# Patient Record
Sex: Male | Born: 1955 | Race: White | Hispanic: No | Marital: Married | Smoking: Never smoker
Health system: Southern US, Community
[De-identification: ages and names within clinical notes are randomized; demographics above are authoritative.]

## PROBLEM LIST (undated history)

## (undated) DIAGNOSIS — E7800 Pure hypercholesterolemia, unspecified: Secondary | ICD-10-CM

## (undated) DIAGNOSIS — IMO0002 Reserved for concepts with insufficient information to code with codable children: Secondary | ICD-10-CM

## (undated) DIAGNOSIS — K469 Unspecified abdominal hernia without obstruction or gangrene: Secondary | ICD-10-CM

## (undated) DIAGNOSIS — I10 Essential (primary) hypertension: Secondary | ICD-10-CM

## (undated) HISTORY — DX: Reserved for concepts with insufficient information to code with codable children: IMO0002

---
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
## 1976-09-05 HISTORY — PX: WISDOM TOOTH EXTRACTION: SHX21

## 2013-07-16 ENCOUNTER — Emergency Department (HOSPITAL_BASED_OUTPATIENT_CLINIC_OR_DEPARTMENT_OTHER): Payer: 59

## 2013-07-16 ENCOUNTER — Emergency Department (HOSPITAL_BASED_OUTPATIENT_CLINIC_OR_DEPARTMENT_OTHER)
Admission: EM | Admit: 2013-07-16 | Discharge: 2013-07-16 | Disposition: A | Discharge status: Home / Self Care | Payer: 59 | Attending: Emergency Medicine | Admitting: Emergency Medicine

## 2013-07-16 ENCOUNTER — Encounter (HOSPITAL_BASED_OUTPATIENT_CLINIC_OR_DEPARTMENT_OTHER): Payer: Self-pay | Admitting: Emergency Medicine

## 2013-07-16 DIAGNOSIS — IMO0002 Reserved for concepts with insufficient information to code with codable children: Secondary | ICD-10-CM

## 2013-07-16 DIAGNOSIS — K2970 Gastritis, unspecified, without bleeding: Secondary | ICD-10-CM

## 2013-07-16 DIAGNOSIS — R1012 Left upper quadrant pain: Secondary | ICD-10-CM | POA: Insufficient documentation

## 2013-07-16 DIAGNOSIS — Z862 Personal history of diseases of the blood and blood-forming organs and certain disorders involving the immune mechanism: Secondary | ICD-10-CM | POA: Insufficient documentation

## 2013-07-16 DIAGNOSIS — Z79899 Other long term (current) drug therapy: Secondary | ICD-10-CM | POA: Insufficient documentation

## 2013-07-16 DIAGNOSIS — K4090 Unilateral inguinal hernia, without obstruction or gangrene, not specified as recurrent: Secondary | ICD-10-CM

## 2013-07-16 DIAGNOSIS — R109 Unspecified abdominal pain: Secondary | ICD-10-CM

## 2013-07-16 DIAGNOSIS — K279 Peptic ulcer, site unspecified, unspecified as acute or chronic, without hemorrhage or perforation: Secondary | ICD-10-CM

## 2013-07-16 DIAGNOSIS — R1013 Epigastric pain: Secondary | ICD-10-CM | POA: Insufficient documentation

## 2013-07-16 DIAGNOSIS — I10 Essential (primary) hypertension: Secondary | ICD-10-CM | POA: Insufficient documentation

## 2013-07-16 DIAGNOSIS — R1011 Right upper quadrant pain: Secondary | ICD-10-CM | POA: Insufficient documentation

## 2013-07-16 DIAGNOSIS — Z8639 Personal history of other endocrine, nutritional and metabolic disease: Secondary | ICD-10-CM | POA: Insufficient documentation

## 2013-07-16 HISTORY — DX: Reserved for concepts with insufficient information to code with codable children: IMO0002

## 2013-07-16 HISTORY — DX: Unspecified abdominal hernia without obstruction or gangrene: K46.9

## 2013-07-16 HISTORY — DX: Essential (primary) hypertension: I10

## 2013-07-16 HISTORY — DX: Pure hypercholesterolemia, unspecified: E78.00

## 2013-07-16 LAB — CBC WITH DIFFERENTIAL/PLATELET
Basophils Relative: 0 % (ref 0–1)
Hemoglobin: 16.5 g/dL (ref 13.0–17.0)
Lymphs Abs: 2.5 10*3/uL (ref 0.7–4.0)
MCH: 31.5 pg (ref 26.0–34.0)
MCHC: 34.2 g/dL (ref 30.0–36.0)
Monocytes Relative: 6 % (ref 3–12)
Neutro Abs: 9.8 10*3/uL — ABNORMAL HIGH (ref 1.7–7.7)
Neutrophils Relative %: 74 % (ref 43–77)
Platelets: 245 10*3/uL (ref 150–400)
RBC: 5.23 MIL/uL (ref 4.22–5.81)
RDW: 13.1 % (ref 11.5–15.5)

## 2013-07-16 LAB — URINALYSIS, ROUTINE W REFLEX MICROSCOPIC
Bilirubin Urine: NEGATIVE
Glucose, UA: NEGATIVE mg/dL
Hgb urine dipstick: NEGATIVE
Ketones, ur: NEGATIVE mg/dL
Specific Gravity, Urine: 1.021 (ref 1.005–1.030)
Urobilinogen, UA: 0.2 mg/dL (ref 0.0–1.0)
pH: 6.5 (ref 5.0–8.0)

## 2013-07-16 LAB — URINE MICROSCOPIC-ADD ON

## 2013-07-16 LAB — COMPREHENSIVE METABOLIC PANEL
ALT: 64 U/L — ABNORMAL HIGH (ref 0–53)
Albumin: 4.3 g/dL (ref 3.5–5.2)
Alkaline Phosphatase: 88 U/L (ref 39–117)
BUN: 16 mg/dL (ref 6–23)
Chloride: 99 mEq/L (ref 96–112)
GFR calc non Af Amer: 74 mL/min — ABNORMAL LOW (ref >90)
Glucose, Bld: 166 mg/dL — ABNORMAL HIGH (ref 70–99)
Potassium: 3.8 mEq/L (ref 3.5–5.1)
Sodium: 138 mEq/L (ref 135–145)
Total Bilirubin: 0.3 mg/dL (ref 0.3–1.2)

## 2013-07-16 LAB — LIPASE, BLOOD: Lipase: 27 U/L (ref 11–59)

## 2013-07-16 MED ORDER — OMEPRAZOLE 20 MG PO CPDR: 20.0000 mg | DELAYED_RELEASE_CAPSULE | Freq: Two times a day (BID) | ORAL | Status: DC

## 2013-07-16 MED ORDER — OXYCODONE-ACETAMINOPHEN 5-325 MG PO TABS: 2.0000 | ORAL_TABLET | ORAL | Status: DC | PRN

## 2013-07-16 MED ORDER — SUCRALFATE 1 G PO TABS: 1.0000 g | ORAL_TABLET | Freq: Four times a day (QID) | ORAL | Status: DC

## 2013-07-16 MED ORDER — PANTOPRAZOLE SODIUM 40 MG IV SOLR
40.0000 mg | Freq: Once | INTRAVENOUS | Status: AC
  Administered 2013-07-16: 40 mg via INTRAVENOUS

## 2013-07-16 MED ORDER — HYDROMORPHONE HCL PF 1 MG/ML IJ SOLN
INTRAMUSCULAR | Status: AC
  Administered 2013-07-16: 1 mg via INTRAVENOUS
  Filled 2013-07-16: qty 1

## 2013-07-16 MED ORDER — ONDANSETRON 4 MG PO TBDP: 4.0000 mg | ORAL_TABLET | Freq: Three times a day (TID) | ORAL | Status: DC | PRN

## 2013-07-16 MED ORDER — HYDROMORPHONE HCL PF 1 MG/ML IJ SOLN
1.0000 mg | INTRAMUSCULAR | Status: AC | PRN
  Administered 2013-07-16 (×2): 1 mg via INTRAVENOUS
  Filled 2013-07-16: qty 1

## 2013-07-16 MED ORDER — ONDANSETRON HCL 4 MG/2ML IJ SOLN
4.0000 mg | Freq: Once | INTRAMUSCULAR | Status: AC
  Administered 2013-07-16: 4 mg via INTRAVENOUS

## 2013-07-16 MED ORDER — HYDROMORPHONE HCL PF 1 MG/ML IJ SOLN
1.0000 mg | INTRAMUSCULAR | Status: DC | PRN
  Administered 2013-07-16: 1 mg via INTRAVENOUS
  Filled 2013-07-16: qty 1

## 2013-07-16 MED ORDER — ONDANSETRON HCL 4 MG/2ML IJ SOLN
INTRAMUSCULAR | Status: AC
  Administered 2013-07-16: 4 mg via INTRAVENOUS
  Filled 2013-07-16: qty 2

## 2013-07-16 MED ORDER — LORAZEPAM 1 MG PO TABS: 1.0000 mg | ORAL_TABLET | ORAL | Status: DC | PRN

## 2013-07-16 MED ORDER — PANTOPRAZOLE SODIUM 40 MG IV SOLR
INTRAVENOUS | Status: AC
  Administered 2013-07-16: 40 mg via INTRAVENOUS
  Filled 2013-07-16: qty 40

## 2013-07-16 NOTE — ED Notes (Signed)
Patient transported to Ultrasound 

## 2013-07-16 NOTE — ED Notes (Signed)
RN bedside.

## 2013-07-16 NOTE — ED Provider Notes (Signed)
CSN: [id number]     Arrival date & time 07/16/13  4098 History   First MD Initiated Contact with Patient 07/16/13 [phone]     Chief Complaint  Patient presents with  . Abdominal Pain  . Emesis    HPI  Patient presents with abdominal pain since early this morning. Primarily epigastric but is somewhat of the bilateral upper quadrants. It is not extending to his chest, neck, back, or shoulders. No lower abdominal pain. He has had several episodes of emesis. It is not relieved his pain. Emesis is nonbloody, nonbilious. Normal bowel movement yesterday. He has not been constipated or having diarrhea. No fevers. He does not smoke. He drinks 5 or 6 alcoholic beverages a day. He has not had a past similar episodes. He has no history of alcohol related liver or pancreas abnormalities.  Past Medical History  Diagnosis Date  . Hypertension   . High cholesterol   . Hernia    History reviewed. No pertinent past surgical history. History reviewed. No pertinent family history. History  Substance Use Topics  . Smoking status: Never Smoker   . Smokeless tobacco: Not on file  . Alcohol Use: 3.0 oz/week    5 Cans of beer per week    Review of Systems  Constitutional: Negative for fever, chills, diaphoresis, appetite change and fatigue.  HENT: Negative for mouth sores, sore throat and trouble swallowing.   Eyes: Negative for visual disturbance.  Respiratory: Negative for cough, chest tightness, shortness of breath and wheezing.   Cardiovascular: Negative for chest pain.  Gastrointestinal: Positive for nausea, vomiting and abdominal pain. Negative for diarrhea and abdominal distention.  Endocrine: Negative for polydipsia, polyphagia and polyuria.  Genitourinary: Negative for dysuria, frequency and hematuria.  Musculoskeletal: Negative for gait problem.  Skin: Negative for color change, pallor and rash.  Neurological: Negative for dizziness, syncope, light-headedness and headaches.  Hematological:  Does not bruise/bleed easily.  Psychiatric/Behavioral: Negative for behavioral problems and confusion.    Allergies  Review of patient's allergies indicates no known allergies.  Home Medications   Current Outpatient Rx  Name  Route  Sig  Dispense  Refill  . omeprazole (PRILOSEC) 20 MG capsule   Oral   Take 1 capsule (20 mg total) by mouth 2 (two) times daily.   60 capsule   0   . ondansetron (ZOFRAN ODT) 4 MG disintegrating tablet   Oral   Take 1 tablet (4 mg total) by mouth every 8 (eight) hours as needed for nausea.   20 tablet   0   . oxyCODONE-acetaminophen (PERCOCET/ROXICET) 5-325 MG per tablet   Oral   Take 2 tablets by mouth every 4 (four) hours as needed.   6 tablet   0   . oxyCODONE-acetaminophen (PERCOCET/ROXICET) 5-325 MG per tablet   Oral   Take 2 tablets by mouth every 4 (four) hours as needed.   30 tablet   0   . sucralfate (CARAFATE) 1 G tablet   Oral   Take 1 tablet (1 g total) by mouth 4 (four) times daily.   60 tablet   0    BP 155/90  Pulse 92  Temp(Src) 97.4 F (36.3 C) (Oral)  Resp 16  Ht 6\' 2"  (1.88 m)  Wt 245 lb (111.131 kg)  BMI 31.44 kg/m2  SpO2 96% Physical Exam  Constitutional: He is oriented to person, place, and time. He appears well-developed and well-nourished. He appears distressed.  Appears painful.    HENT:  Head: Normocephalic.  Eyes: Conjunctivae are normal. Pupils are equal, round, and reactive to light. No scleral icterus.  Conjunctiva are not pale. No scleral icterus.  Neck: Normal range of motion. Neck supple. No thyromegaly present.  Cardiovascular: Normal rate and regular rhythm.  Exam reveals no gallop and no friction rub.   No murmur heard. Pulmonary/Chest: Effort normal and breath sounds normal. No respiratory distress. He has no wheezes. He has no rales.  Abdominal: Soft. Bowel sounds are normal. He exhibits no distension. There is tenderness in the right upper quadrant, epigastric area and left upper  quadrant. There is no rigidity, no rebound and no guarding.    Nontender in the lower abdomen. Primary area of tenderness is epigastric. Has a rapid quadrant tenderness. Does not have an abnormal Murphy's sign.  Musculoskeletal: Normal range of motion.  Neurological: He is alert and oriented to person, place, and time.  Skin: Skin is warm and dry. No rash noted.  Psychiatric: He has a normal mood and affect. His behavior is normal.    ED Course  Procedures (including critical care time) Labs Review Labs Reviewed  CBC WITH DIFFERENTIAL - Abnormal; Notable for the following:    WBC 13.2 (*)    Neutro Abs 9.8 (*)    All other components within normal limits  COMPREHENSIVE METABOLIC PANEL - Abnormal; Notable for the following:    Glucose, Bld 166 (*)    ALT 64 (*)    GFR calc non Af Amer 74 (*)    GFR calc Af Amer 86 (*)    All other components within normal limits  URINALYSIS, ROUTINE W REFLEX MICROSCOPIC - Abnormal; Notable for the following:    APPearance CLOUDY (*)    Leukocytes, UA SMALL (*)    All other components within normal limits  URINE MICROSCOPIC-ADD ON - Abnormal; Notable for the following:    Bacteria, UA MANY (*)    All other components within normal limits  URINE CULTURE  LIPASE, BLOOD  TROPONIN I   Imaging Review US Abdomen Complete  07/16/2013   CLINICAL DATA:  Epigastric pain. Nausea and vomiting.  EXAM: ULTRASOUND ABDOMEN COMPLETE  COMPARISON:  None.  FINDINGS: Gallbladder  No stones, wall thickening or pericholecystic fluid. Possible 0.2 cm polyp is incidentally noted. Sonographer reports negative Murphy's sign.  Common bile duct  Diameter: 0.3 cm.  Liver  The liver demonstrates diffusely increased echogenicity without focal lesion or intrahepatic biliary ductal dilatation.  IVC  No abnormality visualized.  Pancreas  Visualized portion unremarkable.  Spleen  Size and appearance within normal limits.  Right Kidney  Length: 11.9 cm. Echogenicity within normal  limits. No mass or hydronephrosis visualized.  Left Kidney  Length: 12.1 cm. Echogenicity within normal limits. No mass or hydronephrosis visualized.  Abdominal aorta  No aneurysm visualized.  IMPRESSION: Negative for gallstones. No acute finding. Possible 0.2 cm gallbladder polyp noted.  Fatty infiltration of the liver.   Electronically Signed   By: Drusilla Kanner M.D.   On: 07/16/2013 09:03   Dg Abd Acute W/chest  07/16/2013   CLINICAL DATA:  Epigastric pain, nausea and vomiting.  EXAM: ACUTE ABDOMEN SERIES (ABDOMEN 2 VIEW & CHEST 1 VIEW)  COMPARISON:  Single view of the chest 06/30/2010.  FINDINGS: Single view of the chest demonstrates clear lungs and normal heart size. No pneumothorax or pleural fluid.  Two views of the abdomen show no free intraperitoneal air or evidence of bowel obstruction. Multiple gas-filled but nondilated loops of small bowel are identified. No  abnormal abdominal calcification is seen.  IMPRESSION: Gas-filled but nondilated loops of small bowel most suggestive gastroenteritis. Negative for free air or bowel obstruction.  No acute cardiopulmonary disease.   Electronically Signed   By: Drusilla Kanner M.D.   On: 07/16/2013 09:00    EKG Interpretation     Ventricular Rate:  71 PR Interval:  220 QRS Duration: 92 QT Interval:  398 QTC Calculation: 432 R Axis:   7 Text Interpretation:  Sinus rhythm with 1st degree A-V block Otherwise normal ECG            MDM   1. Abdominal pain   2. Peptic ulcer disease   3. Gastritis   4. Inguinal hernia     Epigastric abdominal pain and 57 year old male started spontaneously at night with a history of alcohol abuse. Different diagnosis would include alcohol related gastritis ulcer hepatitis pancreatitis. Biliary colic or cholecystitis. His laboratory evaluation IV pain medication and antiemetics, plus Protonix.  08:00:  Re-evaluation.  Patient's pain went from an 8 to a 7 to a 4 with a first, and second dose of 1 mg IV  Dilaudid. His hepatobiliary or pancreatic enzymes are normal. White blood cell count is slightly elevated. Pain remains primarily epigastric and medial bilateral upper quadrants. No chest pain. I requested a troponin and a urine, although his pain does not seem cardiac in his not in his chest now nor has it been. His pain is not in the flank. I requested an ultrasound to evaluate for biliary dilatation or obvious stones. Requested abdominal series x-rays return if there is no free air or obstruction. His exam does not suggest obstruction. He does not have high-pitched rushes. CT [facility] because of mechanical issue as the CT scanner. The plan at this point is to continue to control his pain evaluated him for the above abnormalities with ultrasound, additional labs, and plain film x-ray series. I will further evaluate his symptoms and need for additional studies including possibility of CT after the above.  10:50:  Reevaluation. Patient has great pain relief. He is actually intermittently sleeping. He awakens easily. He is not sedate. He rates his pain is 1/10. He is to localize to his epigastrium. Troponin is normal. Ultrasound shows no biliary abnormalities or stones. Has had a gallbladder polyp. No sign of perforation or obstruction on abdominal series. I think is appropriate for outpatient treatment. I do not find concerns for peritonitis or surgical condition. I do not fill his CT scanning at this point is indicated or would be helpful. My plan is Carafate, proton pump inhibitor, Zofran and Vicodin. Return for fevers bloody diarrhea worsening pain other symptoms. Surgical referral regarding his inguinal hernia.   Roney Marion, MD 07/17/13 [phone]

## 2013-07-16 NOTE — ED Notes (Signed)
MD at bedside. 

## 2013-07-16 NOTE — ED Notes (Signed)
MD at bedside discussing test results and plan of care. 

## 2013-07-16 NOTE — ED Notes (Signed)
Epigastric pain since 3am with vomiting x2.

## 2013-07-16 NOTE — ED Notes (Signed)
Pt wife called and sts pt does not have a PCP and needs more than 6 pain meds that were prescribed today. Dr. Inda Merlin he will prescribe more pain meds to [facility] outpt pharm. Wife will pick up med for pt.

## 2013-07-17 LAB — URINE CULTURE: Culture: NO GROWTH

## 2013-09-05 HISTORY — PX: POLYPECTOMY: SHX149

## 2013-09-05 HISTORY — PX: COLONOSCOPY: SHX174

## 2013-11-26 ENCOUNTER — Ambulatory Visit (INDEPENDENT_AMBULATORY_CARE_PROVIDER_SITE_OTHER): Payer: 59 | Admitting: General Surgery

## 2013-11-26 ENCOUNTER — Encounter (INDEPENDENT_AMBULATORY_CARE_PROVIDER_SITE_OTHER): Payer: Self-pay | Admitting: General Surgery

## 2013-11-26 VITALS — BP 142/100 | HR 80 | Temp 97.6°F | Resp 14 | Ht 73.0 in | Wt 230.4 lb

## 2013-11-26 DIAGNOSIS — K4090 Unilateral inguinal hernia, without obstruction or gangrene, not specified as recurrent: Secondary | ICD-10-CM

## 2013-11-26 DIAGNOSIS — I10 Essential (primary) hypertension: Secondary | ICD-10-CM

## 2013-11-26 NOTE — Addendum Note (Signed)
Addended by: Ivor Costa on: 11/26/2013 03:10 PM   Modules accepted: Orders

## 2013-11-26 NOTE — Progress Notes (Signed)
Patient ID: Anthony Moody, male   DOB: 11-23-1955, 58 y.o.   MRN: [id number]  Chief Complaint  Patient presents with  . New Evaluation    eval [facility]    HPI Anthony Moody is a 58 y.o. male.  The patient is a 58 year old male who has a chief complaint of a left inguinal hernia. The patient was seen in the ER in November of last year. He states that the hernia he noticed was a few months before that time. He's had some discomfort in the area. He notices no pain in the area and  Has had no signs or symptoms of incarceration.   Of note the patient currently has no primary care physician and has hypertension with blood pressure 142/100 today. HPI  Past Medical History  Diagnosis Date  . Hypertension   . High cholesterol   . Hernia     Past Surgical History  Procedure Laterality Date  . Tonsilectomy/adenoidectomy with myringotomy  09/05/1966    History reviewed. No pertinent family history.  Social History History  Substance Use Topics  . Smoking status: Never Smoker   . Smokeless tobacco: Never Used  . Alcohol Use: 3.0 oz/week    5 Cans of beer per week     Comment: occ    No Known Allergies  No current outpatient prescriptions on file.   No current facility-administered medications for this visit.    Review of Systems Review of Systems  Constitutional: Negative.   HENT: Negative.   Eyes: Negative.   Respiratory: Negative.   Cardiovascular: Negative.   Gastrointestinal: Negative.   Endocrine: Negative.   Neurological: Negative.     Blood pressure 142/100, pulse 80, temperature 97.6 F (36.4 C), temperature source Temporal, resp. rate 14, height 6\' 1"  (1.854 m), weight 230 lb 6.4 oz (104.509 kg).  Physical Exam Physical Exam  Constitutional: He is oriented to person, place, and time. He appears well-developed and well-nourished.  HENT:  Head: Normocephalic and atraumatic.  Eyes: Conjunctivae and EOM are normal. Pupils are equal, round, and reactive to light.   Neck: Normal range of motion. Neck supple.  Cardiovascular: Normal rate, regular rhythm and normal heart sounds.   Pulmonary/Chest: Effort normal and breath sounds normal.  Abdominal: Soft. Bowel sounds are normal. He exhibits no distension and no mass. There is no tenderness. There is no rebound and no guarding. A hernia is present. Hernia confirmed positive in the left inguinal area. Hernia confirmed negative in the right inguinal area.  Musculoskeletal: Normal range of motion.  Neurological: He is alert and oriented to person, place, and time.  Skin: Skin is warm and dry.    Data Reviewed As above  Assessment    58 year old male with a large left inguinal hernia.     Plan    1. We'll have the patient evaluated by medicine physicians weight for his high blood pressure. 2. At that point once he's been evaluated we can discuss scheduling surgery for a open left inguinal hernia repair with mesh. 3.All risks and benefits were discussed with the patient, to generally include infection, bleeding, damage to surrounding structures, acute and chronic nerve pain, and recurrence. Alternatives were offered and described.  All questions were answered and the patient voiced understanding of the procedure and wishes to proceed at this point.        Rosario Jacks., Sheray Grist 11/26/2013, 10:55 AM

## 2013-11-28 ENCOUNTER — Telehealth (INDEPENDENT_AMBULATORY_CARE_PROVIDER_SITE_OTHER): Payer: Self-pay

## 2013-11-28 NOTE — Telephone Encounter (Signed)
Message copied by Ivor Costa on Thu Nov 28, 2013  4:40 PM ------      Message from: [place], [place]: Thu Nov 28, 2013  2:57 PM      Contact: [phone]       PLEASE CALL HIM HE HAS A QUESTION ABOUT SOME DOCTOR DR.RAMIREZ WAS TALKING TO HIM ABOUT I LOOKED IN THE NOTE I DON'T SEE A DOCTOR IN THE NOTE ------

## 2013-11-28 NOTE — Telephone Encounter (Signed)
Called and spoke to patient to make aware that our office will schedule the Referral to Dr. Ardeth Perfect.  Request has been sent to his office and we will call patient once we have appointment date & time.  Patient verbalized understanding.

## 2014-03-31 ENCOUNTER — Encounter: Payer: Self-pay | Admitting: Gastroenterology

## 2014-05-05 ENCOUNTER — Ambulatory Visit (AMBULATORY_SURGERY_CENTER): Payer: Self-pay | Admitting: *Deleted

## 2014-05-05 VITALS — Ht 74.0 in | Wt 214.8 lb

## 2014-05-05 DIAGNOSIS — Z1211 Encounter for screening for malignant neoplasm of colon: Secondary | ICD-10-CM

## 2014-05-05 MED ORDER — MOVIPREP 100 G PO SOLR: ORAL | Status: DC

## 2014-05-05 NOTE — Progress Notes (Signed)
No allergies to eggs or soy. No problems with anesthesia.  Pt given Emmi instructions for colonoscopy  No oxygen use  No diet drug use  

## 2014-05-19 ENCOUNTER — Encounter: Payer: Self-pay | Admitting: Gastroenterology

## 2014-05-19 ENCOUNTER — Ambulatory Visit (AMBULATORY_SURGERY_CENTER): Payer: 59 | Admitting: Gastroenterology

## 2014-05-19 VITALS — BP 131/82 | HR 65 | Temp 97.9°F | Resp 17 | Ht 74.0 in | Wt 214.0 lb

## 2014-05-19 DIAGNOSIS — Z1211 Encounter for screening for malignant neoplasm of colon: Secondary | ICD-10-CM

## 2014-05-19 DIAGNOSIS — D122 Benign neoplasm of ascending colon: Secondary | ICD-10-CM

## 2014-05-19 DIAGNOSIS — D126 Benign neoplasm of colon, unspecified: Secondary | ICD-10-CM

## 2014-05-19 MED ORDER — SODIUM CHLORIDE 0.9 % IV SOLN: 500.0000 mL | INTRAVENOUS | Status: DC

## 2014-05-19 NOTE — Progress Notes (Signed)
Called to room to assist during endoscopic procedure.  Patient ID and intended procedure confirmed with present staff. Received instructions for my participation in the procedure from the performing physician.  

## 2014-05-19 NOTE — Patient Instructions (Signed)
Discharge instructions given with verbal understanding. Handouts on polyps and hemorrhoids. Hold aspirin,aspirin products and anti inflammatory medications for 2 weeks. Resume previous medications. YOU HAD AN ENDOSCOPIC PROCEDURE TODAY AT [facility]: Refer to the procedure report that was given to you for any specific questions about what was found during the examination.  If the procedure report does not answer your questions, please call your gastroenterologist to clarify.  If you requested that your care partner not be given the details of your procedure findings, then the procedure report has been included in a sealed envelope for you to review at your convenience later.  YOU SHOULD EXPECT: Some feelings of bloating in the abdomen. Passage of more gas than usual.  Walking can help get rid of the air that was put into your GI tract during the procedure and reduce the bloating. If you had a lower endoscopy (such as a colonoscopy or flexible sigmoidoscopy) you may notice spotting of blood in your stool or on the toilet paper. If you underwent a bowel prep for your procedure, then you may not have a normal bowel movement for a few days.  DIET: Your first meal following the procedure should be a light meal and then it is ok to progress to your normal diet.  A half-sandwich or bowl of soup is an example of a good first meal.  Heavy or fried foods are harder to digest and may make you feel nauseous or bloated.  Likewise meals heavy in dairy and vegetables can cause extra gas to form and this can also increase the bloating.  Drink plenty of fluids but you should avoid alcoholic beverages for 24 hours.  ACTIVITY: Your care partner should take you home directly after the procedure.  You should plan to take it easy, moving slowly for the rest of the day.  You can resume normal activity the day after the procedure however you should NOT DRIVE or use heavy machinery for 24 hours (because of the  sedation medicines used during the test).    SYMPTOMS TO REPORT IMMEDIATELY: A gastroenterologist can be reached at any hour.  During normal business hours, 8:30 AM to 5:00 PM Monday through Friday, call [phone].  After hours and on weekends, please call the GI answering service at [phone] who will take a message and have the physician on call contact you.   Following lower endoscopy (colonoscopy or flexible sigmoidoscopy):  Excessive amounts of blood in the stool  Significant tenderness or worsening of abdominal pains  Swelling of the abdomen that is new, acute  Fever of 100F or higher FOLLOW UP: If any biopsies were taken you will be contacted by phone or by letter within the next 1-3 weeks.  Call your gastroenterologist if you have not heard about the biopsies in 3 weeks.  Our staff will call the home number listed on your records the next business day following your procedure to check on you and address any questions or concerns that you may have at that time regarding the information given to you following your procedure. This is a courtesy call and so if there is no answer at the home number and we have not heard from you through the emergency physician on call, we will assume that you have returned to your regular daily activities without incident.  SIGNATURES/CONFIDENTIALITY: You and/or your care partner have signed paperwork which will be entered into your electronic medical record.  These signatures attest to the fact that  that the information above on your After Visit Summary has been reviewed and is understood.  Full responsibility of the confidentiality of this discharge information lies with you and/or your care-partner.

## 2014-05-19 NOTE — Progress Notes (Signed)
Procedure ends, to recovery, report given and VSS. 

## 2014-05-19 NOTE — Op Note (Signed)
[place]  [address]. [place], [id number]   COLONOSCOPY PROCEDURE REPORT  PATIENT: Anthony Moody, Anthony Moody  MR#: [id number] BIRTHDATE: Feb 26, 1956 , 57  yrs. old GENDER: Male ENDOSCOPIST: Ladene Artist, MD, [facility] REFERRED JE:HUDJS Ardeth Perfect, M.D. PROCEDURE DATE:  05/19/2014 PROCEDURE:   Colonoscopy with snare polypectomy First Screening Colonoscopy - Avg.  risk and is 50 yrs.  old or older Yes.  Prior Negative Screening - Now for repeat screening. N/A  History of Adenoma - Now for follow-up colonoscopy & has been > or = to 3 yrs.  N/A  Polyps Removed Today? Yes. ASA CLASS:   Class II INDICATIONS:average risk screening. MEDICATIONS: propofol (Diprivan) 510mg  IV DESCRIPTION OF PROCEDURE:   After the risks benefits and alternatives of the procedure were thoroughly explained, informed consent was obtained.  A digital rectal exam revealed no abnormalities of the rectum.   The [id number]  endoscope was introduced through the anus and advanced to the cecum, which was identified by both the appendix and ileocecal valve. No adverse events experienced  with a tortuous and redundant colon.  Recurrent colonoscopic looping encountered. The quality of the prep was good, using MoviPrep  The instrument was then slowly withdrawn as the colon was fully examined.  COLON FINDINGS: A sessile polyp measuring 8 mm in size was found in the ascending colon.  A polypectomy was performed using snare cautery.  The resection was complete and the polyp tissue was completely retrieved. Several, small linear mcosal abrasions in the splenic flexure and sigmoid colon on withdrawl likely from looping. The colon was otherwise normal.  There was no diverticulosis, inflammation, polyps or cancers unless previously stated. Retroflexed views revealed small internal hemorrhoids. The time to cecum=14 minutes 59 seconds.  Withdrawal time=10 minutes 53 seconds.  The scope was withdrawn and the  procedure completed. COMPLICATIONS: There were no complications.  ENDOSCOPIC IMPRESSION: 1.   Sessile polyp measuring 8 mm in the ascending colon; polypectomy performed using snare cautery 2.   Small internal hemorrhoids  RECOMMENDATIONS: 1.  Await pathology results 2.  Repeat colonoscopy in 5 years if polyp adenomatous; otherwise 10 years 3.  Hold aspirin, aspirin products, and anti-inflammatory medication for 2 weeks.  eSigned:  Ladene Artist, MD, [facility] 05/19/2014 2:15 PM

## 2014-05-20 ENCOUNTER — Telehealth: Payer: Self-pay | Admitting: *Deleted

## 2014-05-20 NOTE — Telephone Encounter (Signed)
  Follow up Call-  Call back number 05/19/2014  Post procedure Call Back phone  # [phone]  Permission to leave phone message Yes     Patient questions:  Do you have a fever, pain , or abdominal swelling? No. Pain Score  0 *  Have you tolerated food without any problems? Yes.    Have you been able to return to your normal activities? Yes.    Do you have any questions about your discharge instructions: Diet   No. Medications  No. Follow up visit  No.  Do you have questions or concerns about your Care? No.  Actions: * If pain score is 4 or above: No action needed, pain <4.

## 2014-05-25 ENCOUNTER — Encounter: Payer: Self-pay | Admitting: Gastroenterology

## 2019-04-15 ENCOUNTER — Encounter: Payer: Self-pay | Admitting: Gastroenterology

## 2020-04-29 ENCOUNTER — Encounter: Payer: Self-pay | Admitting: Gastroenterology

## 2020-05-20 ENCOUNTER — Other Ambulatory Visit: Payer: Self-pay

## 2020-05-20 ENCOUNTER — Ambulatory Visit (AMBULATORY_SURGERY_CENTER): Payer: Self-pay | Admitting: *Deleted

## 2020-05-20 VITALS — Ht 74.0 in | Wt 251.0 lb

## 2020-05-20 DIAGNOSIS — Z86010 Personal history of colonic polyps: Secondary | ICD-10-CM

## 2020-05-20 MED ORDER — SUTAB 1479-225-188 MG PO TABS: 1.0000 | ORAL_TABLET | Freq: Once | ORAL | 0 refills | Status: AC

## 2020-05-20 NOTE — Progress Notes (Signed)
No egg or soy allergy known to patient  No issues with past sedation with any surgeries or procedures no intubation problems in the past  No FH of Malignant Hyperthermia No diet pills per patient No home 02 use per patient  No blood thinners per patient  Pt denies issues with constipation  No A fib or A flutter  EMMI video to pt or via MyChart  COVID 19 guidelines implemented in [facility] today with Pt and RN   [employer] Coupon given to pt in [facility] today , Code to Pharmacy   Due to the COVID-19 pandemic we are asking patients to follow these guidelines. Please only bring one care partner. Please be aware that your care partner may wait in the car in the parking lot or if they feel like they will be too hot to wait in the car, they may wait in the lobby on the 4th floor. All care partners are required to wear a mask the entire time (we do not have any that we can provide them), they need to practice social distancing, and we will do a Covid check for all patient's and care partners when you arrive. Also we will check their temperature and your temperature. If the care partner waits in their car they need to stay in the parking lot the entire time and we will call them on their cell phone when the patient is ready for discharge so they can bring the car to the front of the building. Also all patient's will need to wear a mask into building.  

## 2020-05-26 ENCOUNTER — Encounter: Payer: Self-pay | Admitting: Gastroenterology

## 2020-06-10 ENCOUNTER — Ambulatory Visit: Payer: 59 | Admitting: Cardiology

## 2020-06-10 ENCOUNTER — Encounter: Payer: Self-pay | Admitting: Cardiology

## 2020-06-10 ENCOUNTER — Other Ambulatory Visit: Payer: Self-pay

## 2020-06-10 VITALS — BP 158/87 | HR 80 | Resp 16 | Ht 74.0 in | Wt 252.0 lb

## 2020-06-10 DIAGNOSIS — R9431 Abnormal electrocardiogram [ECG] [EKG]: Secondary | ICD-10-CM

## 2020-06-10 DIAGNOSIS — E782 Mixed hyperlipidemia: Secondary | ICD-10-CM | POA: Insufficient documentation

## 2020-06-10 DIAGNOSIS — I10 Essential (primary) hypertension: Secondary | ICD-10-CM

## 2020-06-10 NOTE — Progress Notes (Signed)
Patient referred by Velna Hatchet, MD for abnormal EKG  Subjective:   Anthony Moody, male    DOB: March 31, 1956, 64 y.o.   MRN: [id number]   Chief Complaint  Patient presents with  . Abnormal ECG  . Hypertension  . New Patient (Initial Visit)     HPI  64 y.o. Caucasian male with hypertension, hyperlipidemia, h/o colon polyps, referred for evaluation of abnormal EKG  Patient works as a Freight forwarder at [employer]. He is active with regular running/walking, denies chest pain, shortness of breath, palpitations, leg edema, orthopnea, PND, TIA/syncope. Blood pressure today more elevated than usual. Baseline EKG showed abnormality at work physical, thus referred to me. HE has regular f/u w/his PCP [id number] and hyperlipidemia.    Past Medical History:  Diagnosis Date  . Hernia   . High cholesterol   . Hypertension   . stomach ulcer 07/16/2013     Past Surgical History:  Procedure Laterality Date  . COLONOSCOPY  2015  . POLYPECTOMY  2015  . TONSILLECTOMY AND ADENOIDECTOMY  09/05/1966  . WISDOM TOOTH EXTRACTION  1978     Social History   Tobacco Use  Smoking Status Never Smoker  Smokeless Tobacco Never Used    Social History   Substance and Sexual Activity  Alcohol Use Yes  . Alcohol/week: 5.0 standard drinks  . Types: 5 Cans of beer per week   Comment: occ     Family History  Problem Relation Age of Onset  . Heart disease Mother   . Heart disease Father   . Hypertension Father   . Colon cancer Neg Hx   . Colon polyps Neg Hx   . Stomach cancer Neg Hx   . Esophageal cancer Neg Hx      Current Outpatient Medications on File Prior to Visit  Medication Sig Dispense Refill  . atorvastatin (LIPITOR) 40 MG tablet Take 40 mg by mouth at bedtime.    Marland Kitchen telmisartan (MICARDIS) 80 MG tablet Take 80 mg by mouth daily.     No current facility-administered medications on file prior to visit.    Cardiovascular and other pertinent studies:  EKG  05/25/2020: Sinus rhythm 92 bpm. 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 EGFR 75. Na/K 139/4.8. Rest of the CMP normal H/H 16/50. MCV 97. Platelets 251 HbA1C n/a Chol 219, TG 121, HDL 51, LDL 144 TSH 2.2 normal   Review of Systems  Cardiovascular: Negative for chest pain, dyspnea on exertion, leg swelling, palpitations and syncope.         Vitals:   06/10/20 1445  BP: (!) 158/87  Pulse: 80  Resp: 16  SpO2: 97%     Body mass index is 32.35 kg/m. Filed Weights   06/10/20 1445  Weight: 252 lb (114.3 kg)     Objective:   Physical Exam Vitals and nursing note reviewed.  Constitutional:      General: He is not in acute distress. Neck:     Vascular: No JVD.  Cardiovascular:     Rate and Rhythm: Normal rate and regular rhythm.     Heart sounds: Murmur heard. High-pitched blowing holosystolic murmur is present with a grade of 1/6 at the apex.   Pulmonary:     Effort: Pulmonary effort is normal.     Breath sounds: Normal breath sounds. No wheezing or rales.         Assessment & Recommendations:   64 y.o. Caucasian male with  hypertension, hyperlipidemia, h/o colon polyps, referred for evaluation of abnormal EKG  Abnormal EKG: While EKG showed probable old inferior infarct, he has had no symptoms to suggest ischemia/infarct. I suspect this could be false positive. I will obtain echocardiogram.   Hypertension: BP elevated today. Continue home monitoring. May need to add another agent, perhaps amlodipine, in future. Defer to PCP.  Hyperlipidemia: Currently on lipitor 40 mg. Has upcoming physical. Could increase to 80 mg, if LDL remains >130  I will see him on as needed basis  Thank you for referring the patient to [place]. Please feel free to contact with any questions.   Nigel Mormon, MD Pager: [phone] Office: [phone]

## 2020-06-11 ENCOUNTER — Other Ambulatory Visit: Payer: Self-pay | Admitting: Cardiology

## 2020-06-11 DIAGNOSIS — R9431 Abnormal electrocardiogram [ECG] [EKG]: Secondary | ICD-10-CM

## 2020-06-11 DIAGNOSIS — I10 Essential (primary) hypertension: Secondary | ICD-10-CM

## 2020-06-15 ENCOUNTER — Ambulatory Visit: Payer: 59

## 2020-06-15 ENCOUNTER — Other Ambulatory Visit: Payer: 59

## 2020-06-15 ENCOUNTER — Other Ambulatory Visit: Payer: Self-pay

## 2020-06-15 DIAGNOSIS — R9431 Abnormal electrocardiogram [ECG] [EKG]: Secondary | ICD-10-CM

## 2020-06-15 DIAGNOSIS — I10 Essential (primary) hypertension: Secondary | ICD-10-CM

## 2020-06-16 ENCOUNTER — Encounter: Payer: Self-pay | Admitting: Cardiology

## 2020-06-16 NOTE — Progress Notes (Signed)
Sure. Please fax the echo results where he wants them to be sent. I will save a letter on his chart.  Thanks MJP

## 2020-06-16 NOTE — Progress Notes (Signed)
Called and spoke with patient regarding his echocardiogram. 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

## 2020-06-19 NOTE — Progress Notes (Signed)
I believe Anthony Moody was working on this.

## 2020-07-01 ENCOUNTER — Encounter: Payer: Self-pay | Admitting: Gastroenterology

## 2020-07-01 ENCOUNTER — Ambulatory Visit (AMBULATORY_SURGERY_CENTER): Payer: 59 | Admitting: Gastroenterology

## 2020-07-01 ENCOUNTER — Other Ambulatory Visit: Payer: Self-pay

## 2020-07-01 VITALS — BP 135/94 | HR 72 | Temp 96.8°F | Resp 12 | Ht 74.0 in | Wt 251.0 lb

## 2020-07-01 DIAGNOSIS — Z86010 Personal history of colonic polyps: Secondary | ICD-10-CM

## 2020-07-01 DIAGNOSIS — D123 Benign neoplasm of transverse colon: Secondary | ICD-10-CM

## 2020-07-01 DIAGNOSIS — D120 Benign neoplasm of cecum: Secondary | ICD-10-CM

## 2020-07-01 DIAGNOSIS — D122 Benign neoplasm of ascending colon: Secondary | ICD-10-CM | POA: Diagnosis not present

## 2020-07-01 MED ORDER — SODIUM CHLORIDE 0.9 % IV SOLN: 500.0000 mL | INTRAVENOUS | Status: DC

## 2020-07-01 NOTE — Progress Notes (Signed)
Report given to PACU, vss 

## 2020-07-01 NOTE — Progress Notes (Signed)
Called to room to assist during endoscopic procedure.  Patient ID and intended procedure confirmed with present staff. Received instructions for my participation in the procedure from the performing physician.  

## 2020-07-01 NOTE — Op Note (Signed)
[place] Patient Name: Anthony Moody Procedure Date: 07/01/2020 1:21 PM MRN: [id number] Endoscopist: Ladene Artist , MD Age: 64 Referring MD:  Date of Birth: 20-Nov-1955 Gender: Male Account #: [id number] Procedure:                Colonoscopy Indications:              High risk colon cancer surveillance: Personal                            history of sessile serrated colon polyp (less than                            10 mm in size) with no dysplasia Medicines:                Monitored Anesthesia Care Procedure:                Pre-Anesthesia Assessment:                           - Prior to the procedure, a History and Physical                            was performed, and patient medications and                            allergies were reviewed. The patient's tolerance of                            previous anesthesia was also reviewed. The risks                            and benefits of the procedure and the sedation                            options and risks were discussed with the patient.                            All questions were answered, and informed consent                            was obtained. Prior Anticoagulants: The patient has                            taken no previous anticoagulant or antiplatelet                            agents. ASA Grade Assessment: II - A patient with                            mild systemic disease. After reviewing the risks                            and benefits, the patient was deemed in  satisfactory condition to undergo the procedure.                           After obtaining informed consent, the colonoscope                            was passed under direct vision. Throughout the                            procedure, the patient's blood pressure, pulse, and                            oxygen saturations were monitored continuously. The                            Colonoscope was introduced  through the anus and                            advanced to the the cecum, identified by                            appendiceal orifice and ileocecal valve. The                            ileocecal valve, appendiceal orifice, and rectum                            were photographed. The quality of the bowel                            preparation was good. The colonoscopy was somewhat                            difficult due to a redundant colon, significant                            looping, focal areas of spasm and a tortuous colon.                            Glucagon 0.5 mg IV given to help reduce spasm. The                            patient tolerated the procedure well. Scope In: 1:38:37 PM Scope Out: 2:05:49 PM Scope Withdrawal Time: 0 hours 22 minutes 24 seconds  Total Procedure Duration: 0 hours 27 minutes 12 seconds  Findings:                 The perianal and digital rectal examinations were                            normal.                           Two sessile polyps were found in the transverse  colon. The polyps were 10 to 16 mm in size. These                            polyps were removed with a hot snare. Resection and                            retrieval were complete.                           Two sessile polyps were found in the ascending                            colon and cecum. The polyps were 5 mm in size.                            These polyps were removed with a cold snare.                            Resection complete for both and retrieval complete                            for one.                           Internal hemorrhoids were found during                            retroflexion. The hemorrhoids were small and Grade                            I (internal hemorrhoids that do not prolapse).                           The exam was otherwise without abnormality on                            direct and retroflexion  views. Complications:            No immediate complications. Estimated blood loss:                            None. Estimated Blood Loss:     Estimated blood loss: none. Impression:               - Two 10 to 16 mm polyps in the transverse colon,                            removed with a hot snare. Resected and retrieved.                           - Two 5 mm polyps in the ascending colon and in the                            cecum, removed with a cold snare. Resected  and                            retrieved.                           - Internal hemorrhoids.                           - The examination was otherwise normal on direct                            and retroflexion views. Recommendation:           - Repeat colonoscopy after studies are complete for                            surveillance based on pathology results.                           - Patient has a contact number available for                            emergencies. The signs and symptoms of potential                            delayed complications were discussed with the                            patient. Return to normal activities tomorrow.                            Written discharge instructions were provided to the                            patient.                           - Resume previous diet.                           - Continue present medications.                           - Await pathology results.                           - No aspirin, ibuprofen, naproxen, or other                            non-steroidal anti-inflammatory drugs for 2 weeks                            after polyp removal. Ladene Artist, MD 07/01/2020 2:11:23 PM This report has been signed electronically.

## 2020-07-01 NOTE — Progress Notes (Signed)
1355 Glucagon 0.5 mg given IV, vss

## 2020-07-01 NOTE — Patient Instructions (Signed)
Please read handouts provided. Continue present medications. Await pathology results. No aspirin, ibuprofen, naproxen, or other non-steriodal anti-inflammatory drugs for 2 weeks.    YOU HAD AN ENDOSCOPIC PROCEDURE TODAY AT THE [facility]:   Refer to the procedure report that was given to you for any specific questions about what was found during the examination.  If the procedure report does not answer your questions, please call your gastroenterologist to clarify.  If you requested that your care partner not be given the details of your procedure findings, then the procedure report has been included in a sealed envelope for you to review at your convenience later.  YOU SHOULD EXPECT: Some feelings of bloating in the abdomen. Passage of more gas than usual.  Walking can help get rid of the air that was put into your GI tract during the procedure and reduce the bloating. If you had a lower endoscopy (such as a colonoscopy or flexible sigmoidoscopy) you may notice spotting of blood in your stool or on the toilet paper. If you underwent a bowel prep for your procedure, you may not have a normal bowel movement for a few days.  Please Note:  You might notice some irritation and congestion in your nose or some drainage.  This is from the oxygen used during your procedure.  There is no need for concern and it should clear up in a day or so.  SYMPTOMS TO REPORT IMMEDIATELY:  Following lower endoscopy (colonoscopy or flexible sigmoidoscopy):  Excessive amounts of blood in the stool  Significant tenderness or worsening of abdominal pains  Swelling of the abdomen that is new, acute  Fever of 100F or higher   For urgent or emergent issues, a gastroenterologist can be reached at any hour by calling [phone]. Do not use MyChart messaging for urgent concerns.    DIET:  We do recommend a small meal at first, but then you may proceed to your regular diet.  Drink plenty of fluids but you  should avoid alcoholic beverages for 24 hours.  ACTIVITY:  You should plan to take it easy for the rest of today and you should NOT DRIVE or use heavy machinery until tomorrow (because of the sedation medicines used during the test).    FOLLOW UP: Our staff will call the number listed on your records 48-72 hours following your procedure to check on you and address any questions or concerns that you may have regarding the information given to you following your procedure. If we do not reach you, we will leave a message.  We will attempt to reach you two times.  During this call, we will ask if you have developed any symptoms of COVID 19. If you develop any symptoms (ie: fever, flu-like symptoms, shortness of breath, cough etc.) before then, please call [phone].  If you test positive for Covid 19 in the 2 weeks post procedure, please call and report this information to us.    If any biopsies were taken you will be contacted by phone or by letter within the next 1-3 weeks.  Please call us at [phone] if you have not heard about the biopsies in 3 weeks.    SIGNATURES/CONFIDENTIALITY: You and/or your care partner have signed paperwork which will be entered into your electronic medical record.  These signatures attest to the fact that that the information above on your After Visit Summary has been reviewed and is understood.  Full responsibility of the confidentiality of this discharge information   discharge information lies with you and/or your care-partner. 

## 2020-07-03 ENCOUNTER — Telehealth: Payer: Self-pay | Admitting: *Deleted

## 2020-07-03 NOTE — Telephone Encounter (Signed)
  Follow up Call-  Call back number 07/01/2020  Post procedure Call Back phone  # [phone]  Permission to leave phone message Yes  Some recent data might be hidden     Patient questions:  Do you have a fever, pain , or abdominal swelling? No. Pain Score  0 *  Have you tolerated food without any problems? Yes.    Have you been able to return to your normal activities? Yes.    Do you have any questions about your discharge instructions: Diet   No. Medications  No. Follow up visit  No.  Do you have questions or concerns about your Care? No.  Actions: * If pain score is 4 or above: No action needed, pain <4.  1. Have you developed a fever since your procedure? no  2.   Have you had an respiratory symptoms (SOB or cough) since your procedure? no  3.   Have you tested positive for COVID 19 since your procedure no  4.   Have you had any family members/close contacts diagnosed with the COVID 19 since your procedure?  no   If yes to any of these questions please route to Joylene John, RN and Joella Prince, RN

## 2020-07-13 ENCOUNTER — Encounter: Payer: Self-pay | Admitting: Gastroenterology

## 2021-10-22 ENCOUNTER — Emergency Department (HOSPITAL_BASED_OUTPATIENT_CLINIC_OR_DEPARTMENT_OTHER): Payer: 59

## 2021-10-22 ENCOUNTER — Emergency Department (HOSPITAL_COMMUNITY): Payer: 59

## 2021-10-22 ENCOUNTER — Encounter (HOSPITAL_BASED_OUTPATIENT_CLINIC_OR_DEPARTMENT_OTHER): Payer: Self-pay | Admitting: Emergency Medicine

## 2021-10-22 ENCOUNTER — Emergency Department (HOSPITAL_BASED_OUTPATIENT_CLINIC_OR_DEPARTMENT_OTHER)
Admission: EM | Admit: 2021-10-22 | Discharge: 2021-10-22 | Disposition: A | Discharge status: Home / Self Care | Payer: 59 | Attending: Emergency Medicine | Admitting: Emergency Medicine

## 2021-10-22 ENCOUNTER — Other Ambulatory Visit: Payer: Self-pay

## 2021-10-22 DIAGNOSIS — N400 Enlarged prostate without lower urinary tract symptoms: Secondary | ICD-10-CM | POA: Diagnosis not present

## 2021-10-22 DIAGNOSIS — K4090 Unilateral inguinal hernia, without obstruction or gangrene, not specified as recurrent: Secondary | ICD-10-CM | POA: Insufficient documentation

## 2021-10-22 DIAGNOSIS — R1012 Left upper quadrant pain: Secondary | ICD-10-CM | POA: Diagnosis present

## 2021-10-22 DIAGNOSIS — R1010 Upper abdominal pain, unspecified: Secondary | ICD-10-CM

## 2021-10-22 DIAGNOSIS — K8020 Calculus of gallbladder without cholecystitis without obstruction: Secondary | ICD-10-CM

## 2021-10-22 LAB — URINALYSIS, ROUTINE W REFLEX MICROSCOPIC
Bilirubin Urine: NEGATIVE
Glucose, UA: NEGATIVE mg/dL
Hgb urine dipstick: NEGATIVE
Ketones, ur: NEGATIVE mg/dL
Leukocytes,Ua: NEGATIVE
Nitrite: NEGATIVE
Protein, ur: NEGATIVE mg/dL
Specific Gravity, Urine: 1.02 (ref 1.005–1.030)
pH: 6 (ref 5.0–8.0)

## 2021-10-22 LAB — COMPREHENSIVE METABOLIC PANEL
ALT: 39 U/L (ref 0–44)
AST: 14 U/L — ABNORMAL LOW (ref 15–41)
Albumin: 4.2 g/dL (ref 3.5–5.0)
Alkaline Phosphatase: 75 U/L (ref 38–126)
Anion gap: 9 (ref 5–15)
BUN: 14 mg/dL (ref 8–23)
CO2: 23 mmol/L (ref 22–32)
Calcium: 9.1 mg/dL (ref 8.9–10.3)
Chloride: 104 mmol/L (ref 98–111)
Creatinine, Ser: 0.94 mg/dL (ref 0.61–1.24)
GFR, Estimated: >60 mL/min (ref >60)
Glucose, Bld: 132 mg/dL — ABNORMAL HIGH (ref 70–99)
Potassium: 3.7 mmol/L (ref 3.5–5.1)
Sodium: 136 mmol/L (ref 135–145)
Total Bilirubin: 0.7 mg/dL (ref 0.3–1.2)
Total Protein: 7.1 g/dL (ref 6.5–8.1)

## 2021-10-22 LAB — CBC WITH DIFFERENTIAL/PLATELET
Abs Immature Granulocytes: 0.05 10*3/uL (ref 0.00–0.07)
Basophils Absolute: 0.1 10*3/uL (ref 0.0–0.1)
Basophils Relative: 1 %
Eosinophils Absolute: 0 10*3/uL (ref 0.0–0.5)
Eosinophils Relative: 0 %
HCT: 46.5 % (ref 39.0–52.0)
Hemoglobin: 15.8 g/dL (ref 13.0–17.0)
Immature Granulocytes: 0 %
Lymphocytes Relative: 16 %
Lymphs Abs: 2 10*3/uL (ref 0.7–4.0)
MCH: 31.3 pg (ref 26.0–34.0)
MCHC: 34 g/dL (ref 30.0–36.0)
MCV: 92.1 fL (ref 80.0–100.0)
Monocytes Absolute: 0.7 10*3/uL (ref 0.1–1.0)
Monocytes Relative: 6 %
Neutro Abs: 9.9 10*3/uL — ABNORMAL HIGH (ref 1.7–7.7)
Neutrophils Relative %: 77 %
Platelets: 256 10*3/uL (ref 150–400)
RBC: 5.05 MIL/uL (ref 4.22–5.81)
RDW: 13 % (ref 11.5–15.5)
WBC: 12.8 10*3/uL — ABNORMAL HIGH (ref 4.0–10.5)
nRBC: 0 % (ref 0.0–0.2)

## 2021-10-22 LAB — LIPASE, BLOOD: Lipase: 27 U/L (ref 11–51)

## 2021-10-22 MED ORDER — ONDANSETRON HCL 4 MG PO TABS
4.0000 mg | ORAL_TABLET | Freq: Three times a day (TID) | ORAL | 0 refills | Status: AC | PRN
Start: 2021-10-22 — End: ?

## 2021-10-22 MED ORDER — IOHEXOL 300 MG/ML  SOLN
125.0000 mL | Freq: Once | INTRAMUSCULAR | Status: AC | PRN
Start: 2021-10-22 — End: 2021-10-22
  Administered 2021-10-22: 125 mL via INTRAVENOUS

## 2021-10-22 MED ORDER — KETOROLAC TROMETHAMINE 30 MG/ML IJ SOLN
30.0000 mg | Freq: Once | INTRAMUSCULAR | Status: AC
Start: 2021-10-22 — End: 2021-10-22
  Administered 2021-10-22: 30 mg via INTRAVENOUS
  Filled 2021-10-22: qty 1

## 2021-10-22 MED ORDER — FENTANYL CITRATE PF 50 MCG/ML IJ SOSY
50.0000 ug | PREFILLED_SYRINGE | Freq: Once | INTRAMUSCULAR | Status: AC
  Administered 2021-10-22: 50 ug via INTRAVENOUS
  Filled 2021-10-22: qty 1

## 2021-10-22 MED ORDER — OXYCODONE-ACETAMINOPHEN 5-325 MG PO TABS: 1.0000 | ORAL_TABLET | ORAL | 0 refills | Status: AC | PRN

## 2021-10-22 MED ORDER — DICYCLOMINE HCL 10 MG/ML IM SOLN
20.0000 mg | Freq: Once | INTRAMUSCULAR | Status: AC
  Administered 2021-10-22: 20 mg via INTRAMUSCULAR
  Filled 2021-10-22: qty 2

## 2021-10-22 MED ORDER — ONDANSETRON HCL 4 MG/2ML IJ SOLN
4.0000 mg | Freq: Once | INTRAMUSCULAR | Status: AC
  Administered 2021-10-22: 4 mg via INTRAVENOUS
  Filled 2021-10-22: qty 2

## 2021-10-22 MED ORDER — ALUM & MAG HYDROXIDE-SIMETH 200-200-20 MG/5ML PO SUSP
30.0000 mL | Freq: Once | ORAL | Status: AC
  Administered 2021-10-22: 30 mL via ORAL
  Filled 2021-10-22: qty 30

## 2021-10-22 NOTE — ED Provider Notes (Signed)
[facility] Provider Note   CSN: [id number] Arrival date & time: 10/22/21  0149     History  Chief Complaint  Patient presents with   Abdominal Pain    Anthony Moody is a 66 y.o. male.  The history is provided by the patient and the spouse.  Abdominal Pain Pain location:  LUQ, RUQ and epigastric Pain quality: pressure   Pain radiates to:  Does not radiate Pain severity:  Severe Onset quality:  Sudden Duration:  5 hours (hours) Timing:  Constant Progression:  Unchanged Chronicity:  New Context: not sick contacts and not trauma   Context comment:  Ate pizza earlier in the evening Relieved by:  Nothing Worsened by:  Nothing Ineffective treatments:  None tried Associated symptoms: no constipation, no diarrhea, no dysuria, no fever and no vomiting   Risk factors: no NSAID use   Patient with known prostate enlargement and inguinal hernia presents with upper abdominal pain of 5 hours durations.  No associated fevers, no vomiting, no diarrhea, no constipation.  No migration of pain.      Home Medications Prior to Admission medications   Medication Sig Start Date End Date Taking? Authorizing Provider  atorvastatin (LIPITOR) 40 MG tablet Take 40 mg by mouth at bedtime. 05/12/20   [provider]  telmisartan (MICARDIS) 80 MG tablet Take 80 mg by mouth daily. 04/28/20   [provider]      Allergies    Patient has no known allergies.    Review of Systems   Review of Systems  Constitutional:  Negative for fever.  HENT:  Negative for facial swelling.   Eyes:  Negative for redness.  Respiratory:  Negative for wheezing and stridor.   Cardiovascular:  Negative for palpitations.  Gastrointestinal:  Positive for abdominal pain. Negative for constipation, diarrhea and vomiting.  Genitourinary:  Negative for dysuria.  Neurological:  Negative for facial asymmetry.  Psychiatric/Behavioral:  Negative for agitation.   All other  systems reviewed and are negative.  Physical Exam Updated Vital Signs BP (!) 155/98 (BP Location: Right Arm)    Pulse 75    Temp 98.1 F (36.7 C) (Oral)    Resp 19    Ht 6\' 2"  (1.88 m)    Wt 108.9 kg    SpO2 99%    BMI 30.81 kg/m  Physical Exam Vitals and nursing note reviewed. Exam conducted with a chaperone present.  Constitutional:      Appearance: Normal appearance. He is not diaphoretic.  HENT:     Head: Normocephalic and atraumatic.     Nose: Nose normal.  Eyes:     Conjunctiva/sclera: Conjunctivae normal.     Pupils: Pupils are equal, round, and reactive to light.  Cardiovascular:     Rate and Rhythm: Normal rate and regular rhythm.     Pulses: Normal pulses.     Heart sounds: Normal heart sounds.  Pulmonary:     Effort: Pulmonary effort is normal.     Breath sounds: Normal breath sounds.  Abdominal:     General: Bowel sounds are normal.     Palpations: Abdomen is soft.     Tenderness: There is no abdominal tenderness. There is no guarding or rebound. Negative signs include Murphy's sign and McBurney's sign.  Musculoskeletal:        General: Normal range of motion.     Cervical back: Normal range of motion and neck supple.  Skin:    General: Skin is warm and  dry.     Capillary Refill: Capillary refill takes less than 2 seconds.  Neurological:     General: No focal deficit present.     Mental Status: He is alert and oriented to person, place, and time.     Deep Tendon Reflexes: Reflexes normal.  Psychiatric:        Mood and Affect: Mood normal.        Behavior: Behavior normal.    ED Results / Procedures / Treatments   Labs (all labs ordered are listed, but only abnormal results are displayed) Results for orders placed or performed during the hospital encounter of 10/22/21  CBC with Differential  Result Value Ref Range   WBC 12.8 (H) 4.0 - 10.5 K/uL   RBC 5.05 4.22 - 5.81 MIL/uL   Hemoglobin 15.8 13.0 - 17.0 g/dL   HCT 46.5 39.0 - 52.0 %   MCV 92.1 80.0 -  100.0 fL   MCH 31.3 26.0 - 34.0 pg   MCHC 34.0 30.0 - 36.0 g/dL   RDW 13.0 11.5 - 15.5 %   Platelets 256 150 - 400 K/uL   nRBC 0.0 0.0 - 0.2 %   Neutrophils Relative % 77 %   Neutro Abs 9.9 (H) 1.7 - 7.7 K/uL   Lymphocytes Relative 16 %   Lymphs Abs 2.0 0.7 - 4.0 K/uL   Monocytes Relative 6 %   Monocytes Absolute 0.7 0.1 - 1.0 K/uL   Eosinophils Relative 0 %   Eosinophils Absolute 0.0 0.0 - 0.5 K/uL   Basophils Relative 1 %   Basophils Absolute 0.1 0.0 - 0.1 K/uL   Immature Granulocytes 0 %   Abs Immature Granulocytes 0.05 0.00 - 0.07 K/uL  Urinalysis, Routine w reflex microscopic Urine, Clean Catch  Result Value Ref Range   Color, Urine YELLOW YELLOW   APPearance CLEAR CLEAR   Specific Gravity, Urine 1.020 1.005 - 1.030   pH 6.0 5.0 - 8.0   Glucose, UA NEGATIVE NEGATIVE mg/dL   Hgb urine dipstick NEGATIVE NEGATIVE   Bilirubin Urine NEGATIVE NEGATIVE   Ketones, ur NEGATIVE NEGATIVE mg/dL   Protein, ur NEGATIVE NEGATIVE mg/dL   Nitrite NEGATIVE NEGATIVE   Leukocytes,Ua NEGATIVE NEGATIVE  Comprehensive metabolic panel  Result Value Ref Range   Sodium 136 135 - 145 mmol/L   Potassium 3.7 3.5 - 5.1 mmol/L   Chloride 104 98 - 111 mmol/L   CO2 23 22 - 32 mmol/L   Glucose, Bld 132 (H) 70 - 99 mg/dL   BUN 14 8 - 23 mg/dL   Creatinine, Ser 0.94 0.61 - 1.24 mg/dL   Calcium 9.1 8.9 - 10.3 mg/dL   Total Protein 7.1 6.5 - 8.1 g/dL   Albumin 4.2 3.5 - 5.0 g/dL   AST 14 (L) 15 - 41 U/L   ALT 39 0 - 44 U/L   Alkaline Phosphatase 75 38 - 126 U/L   Total Bilirubin 0.7 0.3 - 1.2 mg/dL   GFR, Estimated >60 >60 mL/min   Anion gap 9 5 - 15  Lipase, blood  Result Value Ref Range   Lipase 27 11 - 51 U/L   CT ABDOMEN PELVIS W CONTRAST  Result Date: 10/22/2021 CLINICAL DATA:  Abdominal pain. EXAM: CT ABDOMEN AND PELVIS WITH CONTRAST TECHNIQUE: Multidetector CT imaging of the abdomen and pelvis was performed using the standard protocol following bolus administration of intravenous  contrast. RADIATION DOSE REDUCTION: This exam was performed according to the departmental dose-optimization program which includes automated exposure control,  adjustment of the mA and/or kV according to patient size and/or use of iterative reconstruction technique. CONTRAST:  173mL OMNIPAQUE IOHEXOL 300 MG/ML  SOLN COMPARISON:  Abdominal radiograph dated 07/16/2013. FINDINGS: Lower chest: The visualized lung bases are clear. There is coronary vascular calcification. No intra-abdominal free air or free fluid. Hepatobiliary: The liver is unremarkable. No intrahepatic biliary dilatation. Gallstones. No pericholecystic fluid or evidence of acute cholecystitis by CT. Pancreas: Unremarkable. No pancreatic ductal dilatation or surrounding inflammatory changes. Spleen: Normal in size without focal abnormality. Adrenals/Urinary Tract: The adrenal glands are unremarkable. There is no hydronephrosis on the side. There is symmetric enhancement and excretion of contrast by both kidneys. There is a 15 mm right renal medial interpolar cyst. The visualized ureters appear unremarkable. Mildly thickened and trabeculated appearance of the bladder wall, likely related to chronic bladder outlet obstruction. Correlation with urinalysis recommended to exclude acute UTI. Stomach/Bowel: There is a large left inguinal hernia containing a segment of the sigmoid colon. No evidence of obstruction or inflammation of the visualized portion of the herniated sigmoid. The hernia is partially included in the images. There is no bowel obstruction or active inflammation. The appendix is normal. Vascular/Lymphatic: Mild aortoiliac atherosclerotic disease. The IVC is unremarkable. No portal venous gas. There is no adenopathy. Reproductive: The prostate gland is enlarged measuring approximately 7 cm in transverse axial diameter. The seminal vesicles are symmetric. Other: None Musculoskeletal: Mild degenerative changes. No acute osseous pathology.  IMPRESSION: 1. Large left inguinal hernia containing a segment of the sigmoid colon. No evidence of bowel obstruction or inflammation. 2. Cholelithiasis. 3. Enlarged prostate gland with findings of chronic bladder outlet obstruction. Correlation with urinalysis recommended to exclude acute UTI. 4. Aortic Atherosclerosis (ICD10-I70.0). Electronically Signed   By: Anner Crete M.D.   On: 10/22/2021 03:50     Radiology CT ABDOMEN PELVIS W CONTRAST  Result Date: 10/22/2021 CLINICAL DATA:  Abdominal pain. EXAM: CT ABDOMEN AND PELVIS WITH CONTRAST TECHNIQUE: Multidetector CT imaging of the abdomen and pelvis was performed using the standard protocol following bolus administration of intravenous contrast. RADIATION DOSE REDUCTION: This exam was performed according to the departmental dose-optimization program which includes automated exposure control, adjustment of the mA and/or kV according to patient size and/or use of iterative reconstruction technique. CONTRAST:  154mL OMNIPAQUE IOHEXOL 300 MG/ML  SOLN COMPARISON:  Abdominal radiograph dated 07/16/2013. FINDINGS: Lower chest: The visualized lung bases are clear. There is coronary vascular calcification. No intra-abdominal free air or free fluid. Hepatobiliary: The liver is unremarkable. No intrahepatic biliary dilatation. Gallstones. No pericholecystic fluid or evidence of acute cholecystitis by CT. Pancreas: Unremarkable. No pancreatic ductal dilatation or surrounding inflammatory changes. Spleen: Normal in size without focal abnormality. Adrenals/Urinary Tract: The adrenal glands are unremarkable. There is no hydronephrosis on the side. There is symmetric enhancement and excretion of contrast by both kidneys. There is a 15 mm right renal medial interpolar cyst. The visualized ureters appear unremarkable. Mildly thickened and trabeculated appearance of the bladder wall, likely related to chronic bladder outlet obstruction. Correlation with urinalysis  recommended to exclude acute UTI. Stomach/Bowel: There is a large left inguinal hernia containing a segment of the sigmoid colon. No evidence of obstruction or inflammation of the visualized portion of the herniated sigmoid. The hernia is partially included in the images. There is no bowel obstruction or active inflammation. The appendix is normal. Vascular/Lymphatic: Mild aortoiliac atherosclerotic disease. The IVC is unremarkable. No portal venous gas. There is no adenopathy. Reproductive: The prostate gland is enlarged measuring  approximately 7 cm in transverse axial diameter. The seminal vesicles are symmetric. Other: None Musculoskeletal: Mild degenerative changes. No acute osseous pathology. IMPRESSION: 1. Large left inguinal hernia containing a segment of the sigmoid colon. No evidence of bowel obstruction or inflammation. 2. Cholelithiasis. 3. Enlarged prostate gland with findings of chronic bladder outlet obstruction. Correlation with urinalysis recommended to exclude acute UTI. 4. Aortic Atherosclerosis (ICD10-I70.0). Electronically Signed   By: Anner Crete M.D.   On: 10/22/2021 03:50    Procedures Procedures    Medications Ordered in ED Medications  ondansetron (ZOFRAN) injection 4 mg (4 mg Intravenous Given 10/22/21 0348)  iohexol (OMNIPAQUE) 300 MG/ML solution 125 mL (125 mLs Intravenous Contrast Given 10/22/21 0334)  fentaNYL (SUBLIMAZE) injection 50 mcg (50 mcg Intravenous Given 10/22/21 0348)  alum & mag hydroxide-simeth (MAALOX/MYLANTA) 200-200-20 MG/5ML suspension 30 mL (30 mLs Oral Given 10/22/21 0406)  ketorolac (TORADOL) 30 MG/ML injection 30 mg (30 mg Intravenous Given 10/22/21 0426)  dicyclomine (BENTYL) injection 20 mg (20 mg Intramuscular Given 10/22/21 0426)    ED Course/ Medical Decision Making/ A&P                           Medical Decision Making Upper abdominal pain x 5 hours.  Now reports one episode in the past.  No f/c/r.    Problems Addressed: Prostate  enlargement:    Details: Patient informed of need for close follow up.  Urology contact info provided Unilateral inguinal hernia without obstruction or gangrene, recurrence not specified: chronic illness or injury    Details: urology contact info provided Upper abdominal pain: acute illness or injury    Details: CT negative for acute cause but given pain with Need US of the gall bladder, I have ordered this test  Amount and/or Complexity of Data Reviewed Labs: ordered.    Details: reviewed by me: slight elevation of white blood cell count, normal hemoglobin, normal electrolytes normal liver function tests Radiology: ordered.    Details: Reviewed by me: my read CT scan large inguinal hernia without obstruction, enlarged prostate Discussion of management or test interpretation with external provider(s): Dr. Ralene Bathe who will accept patient in transfer for [place]  Risk OTC drugs. Prescription drug management. Risk Details: Patient may have biliary colic,  [place] is the test of choice, transferred for that reason     Final Clinical Impression(s) / ED Diagnoses Final diagnoses:  Upper abdominal pain  Prostate enlargement  Unilateral inguinal hernia without obstruction or gangrene, recurrence not specified    Rx / DC Orders ED Discharge Orders     None         Devante Capano, MD 10/22/21 8182

## 2021-10-22 NOTE — Discharge Instructions (Signed)
Your work-up today showed evidence of gallstones and given your description of symptoms you likely have symptomatic cholelithiasis causing your upper abdominal discomfort.  The imaging also revealed the left side inguinal hernia that you have.  Please follow-up with general surgery for both of these findings as well as your urologist for the prostate.  Please use the pain and nausea medicine to with symptoms and rest and stay hydrated.  If any symptoms change or worsen acutely, please return to the nearest emergency department.

## 2021-10-22 NOTE — ED Triage Notes (Addendum)
Patient arrived via POV c/o abdominal pain x 5 hrs. Patient states pain located across RUQ./LUQ. Patient states 1 episode of emesis. Patient is AO x 4, VS w/ elevated BP, normal gait.

## 2021-10-22 NOTE — ED Provider Notes (Signed)
Care transferred from [facility] for rubber on ultrasound to rule out acute cholecystitis as cause of upper abdominal discomfort.  Patient's ultrasound shows cholelithiasis but no evidence of acute cholecystitis.  I went over all the findings with him and I do suspect he has symptomatic cholelithiasis.  Patient be given instructions to follow-up with general surgery for both the gallstones as well as the inguinal hernia that was confirmed on CT earlier tonight.  He will also follow-up with urology for his enlarged prostate and understands return precautions.  Due to the pain patient still having, give prescription for pain and nausea medicine and he understood return precautions.  You no questions or concerns and was discharged in good condition.   Clinical Impression: 1. Symptomatic cholelithiasis   2. Upper abdominal pain   3. Prostate enlargement   4. Unilateral inguinal hernia without obstruction or gangrene, recurrence not specified     Disposition: Discharge  Condition: Good  I have discussed the results, Dx and Tx plan with the pt(& family if present). He/she/they expressed understanding and agree(s) with the plan. Discharge instructions discussed at great length. Strict return precautions discussed and pt &/or family have verbalized understanding of the instructions. No further questions at time of discharge.    New Prescriptions   ONDANSETRON (ZOFRAN) 4 MG TABLET    Take 1 tablet (4 mg total) by mouth every 8 (eight) hours as needed for nausea or vomiting.   OXYCODONE-ACETAMINOPHEN (PERCOCET/ROXICET) 5-325 MG TABLET    Take 1 tablet by mouth every 4 (four) hours as needed for severe pain.    Follow Up: [place] Schedule an appointment as soon as possible for a visit    Surgery, [facility] [place] [address] [phone]   Please call to  schedule an appointment with general surgery to discuss further management of your gallstones and hernia  [facility] [address] [place]         [place]area Reasner, Gwenyth Allegra, MD 10/22/21 [phone]

## 2023-03-29 IMAGING — CT CT ABD-PELV W/ CM
2 of 5 series · 15 of 46 positions shown, 17 images · IV contrast (agent unspecified)
Comparison: Abdominal radiograph dated 07/16/2013.

CLINICAL DATA: Abdominal pain.

EXAM:
CT ABDOMEN AND PELVIS WITH CONTRAST
TECHNIQUE: Multidetector CT imaging of the abdomen and pelvis was performed
using the standard protocol following bolus administration of
intravenous contrast.

[Series 2: axial st · axial · 0.94mm/px · z∈[+743,+1213]mm · 12 of 106 slices shown, 14 images]
[im 6/106  soft-tissue]
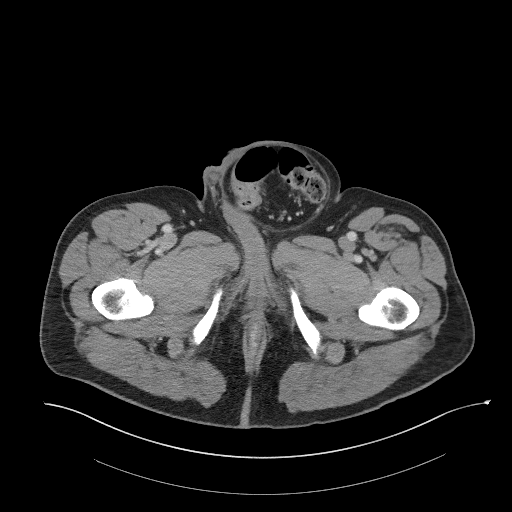
[im 6/106  bone]
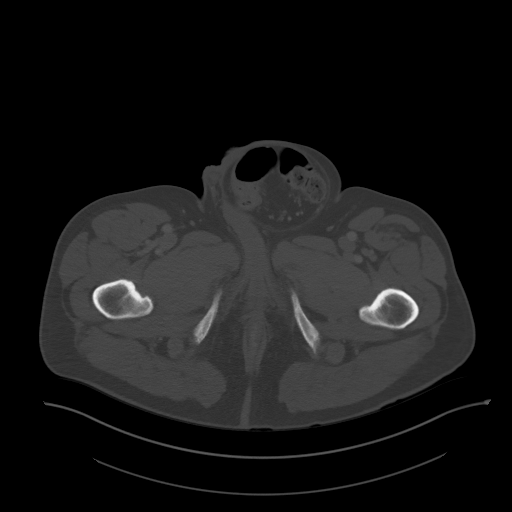
[im 16/106  soft-tissue]
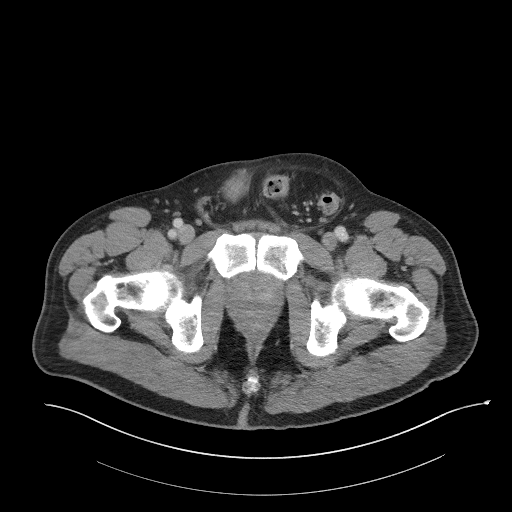
[im 22/106  soft-tissue]
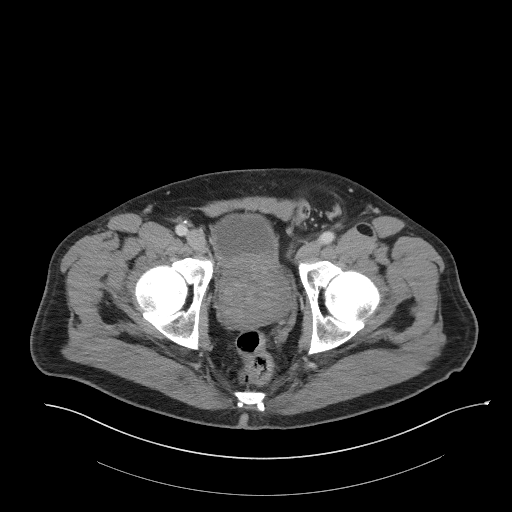
[im 32/106  soft-tissue]
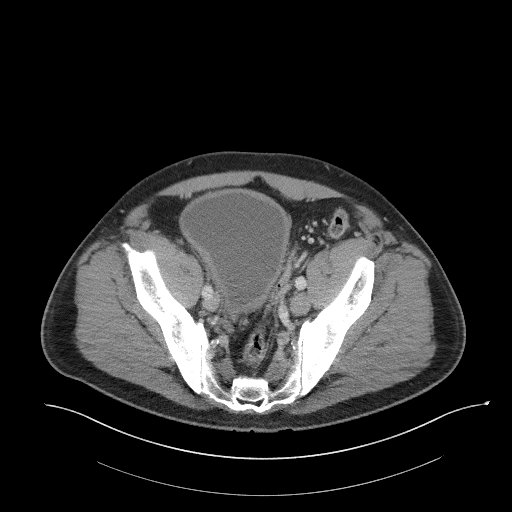
[im 43/106  soft-tissue]
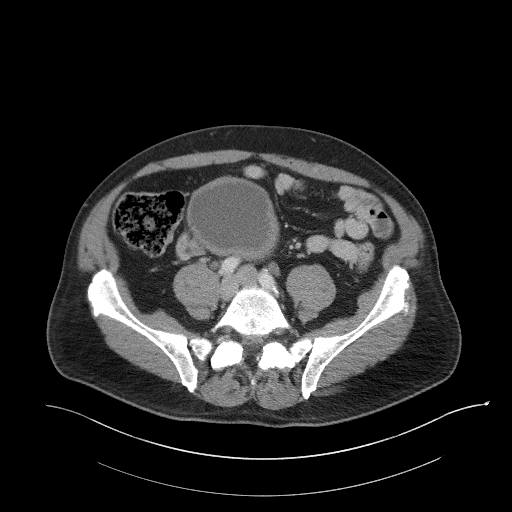
[im 48/106  soft-tissue]
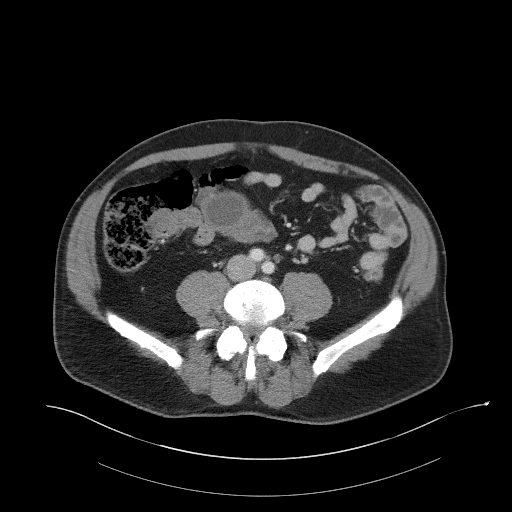
[im 58/106  soft-tissue]
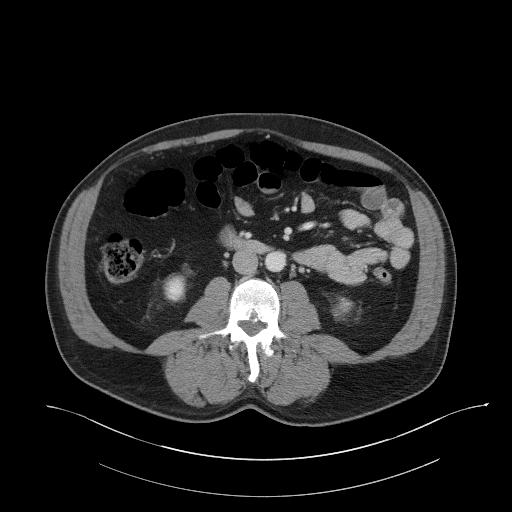
[im 64/106  soft-tissue]
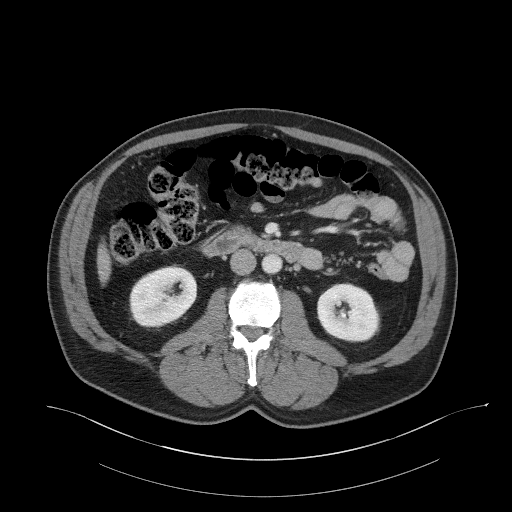
[im 74/106  soft-tissue]
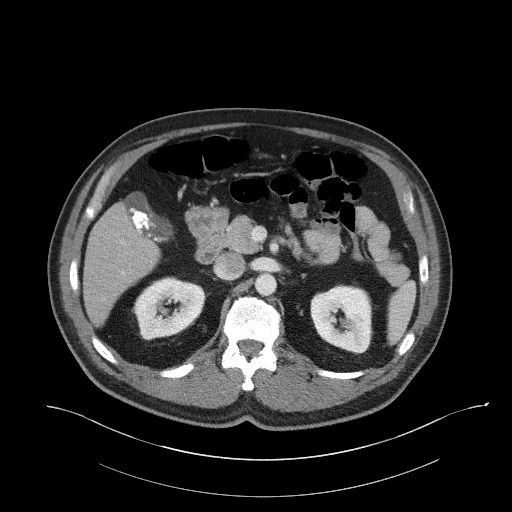
[im 74/106  bone]
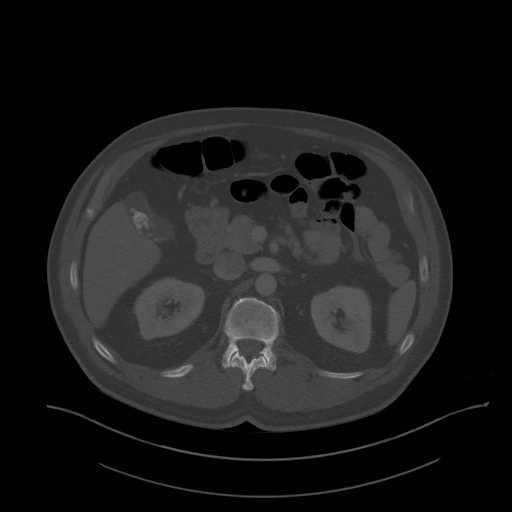
[im 85/106  soft-tissue]
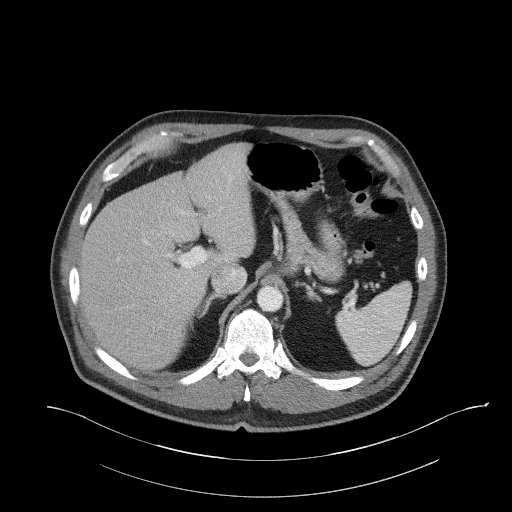
[im 90/106  soft-tissue]
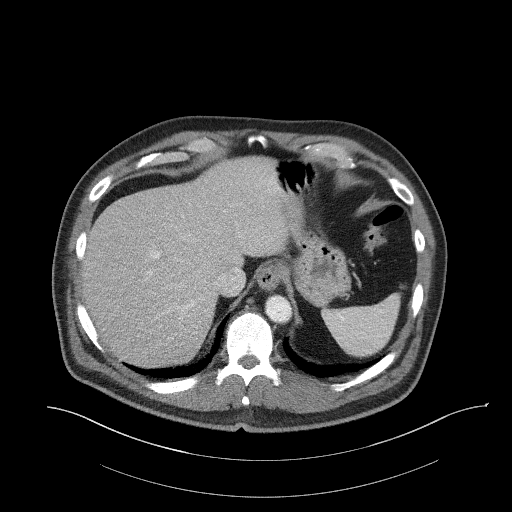
[im 100/106  soft-tissue]
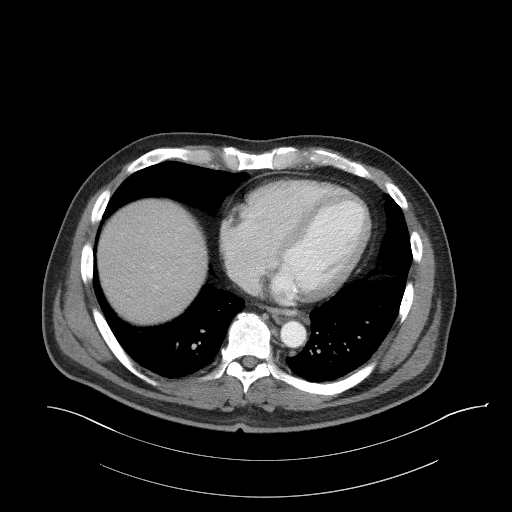

[Series 5: coronal st · coronal · 0.81mm/px · 3 of 101 slices shown]
[im 34/101  soft-tissue]
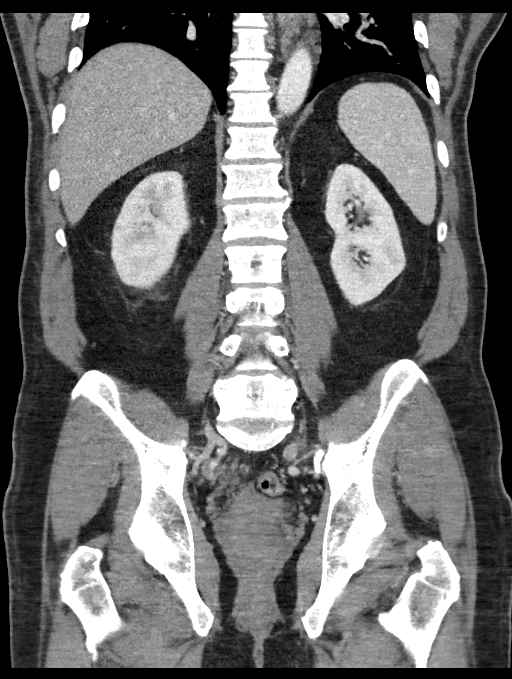
[im 45/101  soft-tissue]
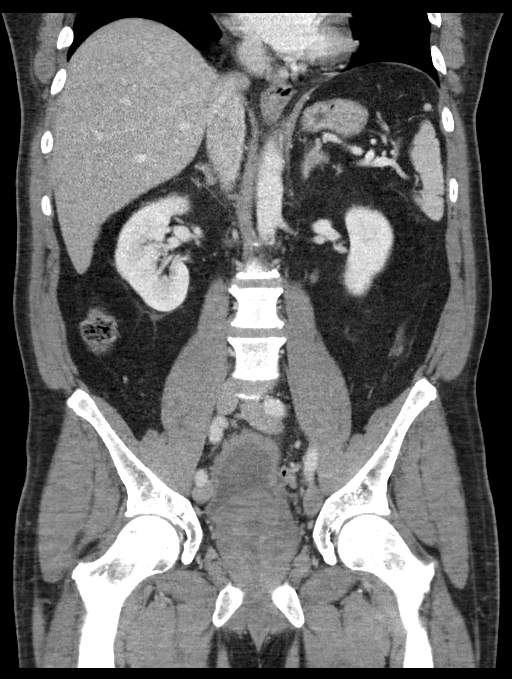
[im 56/101  soft-tissue]
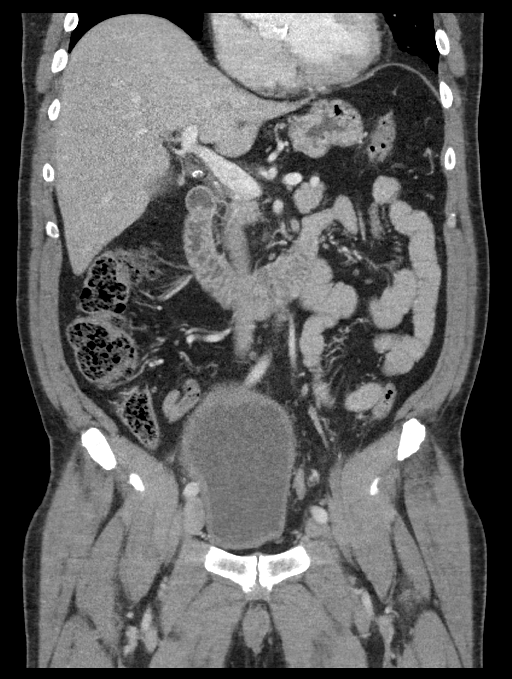

[15 of 46 positions shown; findings below may reference images not displayed]

RADIATION DOSE REDUCTION: This exam was performed according to the
departmental dose-optimization program which includes automated
exposure control, adjustment of the mA and/or kV according to
patient size and/or use of iterative reconstruction technique.

CONTRAST:  125mL OMNIPAQUE IOHEXOL 300 MG/ML  SOLN
FINDINGS: Lower chest: The visualized lung bases are clear. There is coronary
vascular calcification.

No intra-abdominal free air or free fluid.

Hepatobiliary: The liver is unremarkable. No intrahepatic biliary
dilatation. Gallstones. No pericholecystic fluid or evidence of
acute cholecystitis by CT.

Pancreas: Unremarkable. No pancreatic ductal dilatation or
surrounding inflammatory changes.

Spleen: Normal in size without focal abnormality.

Adrenals/Urinary Tract: The adrenal glands are unremarkable. There
is no hydronephrosis on the side. There is symmetric enhancement and
excretion of contrast by both kidneys. There is a 15 mm right renal
medial interpolar cyst. The visualized ureters appear unremarkable.
Mildly thickened and trabeculated appearance of the bladder wall,
likely related to chronic bladder outlet obstruction. Correlation
with urinalysis recommended to exclude acute UTI.

Stomach/Bowel: There is a large left inguinal hernia containing a
segment of the sigmoid colon. No evidence of obstruction or
inflammation of the visualized portion of the herniated sigmoid. The
hernia is partially included in the images.

There is no bowel obstruction or active inflammation. The appendix
is normal.

Vascular/Lymphatic: Mild aortoiliac atherosclerotic disease. The IVC
is unremarkable. No portal venous gas. There is no adenopathy.

Reproductive: The prostate gland is enlarged measuring approximately
7 cm in transverse axial diameter. The seminal vesicles are
symmetric.

Other: None

Musculoskeletal: Mild degenerative changes. No acute osseous
pathology.
IMPRESSION: 1. Large left inguinal hernia containing a segment of the sigmoid
colon. No evidence of bowel obstruction or inflammation.
2. Cholelithiasis.
3. Enlarged prostate gland with findings of chronic bladder outlet
obstruction. Correlation with urinalysis recommended to exclude
acute UTI.
4. Aortic Atherosclerosis ([id number]).

## 2023-03-29 IMAGING — US US ABDOMEN LIMITED
1 series · 15 of 25 positions shown · non-contrast
Comparison: CT of same day.  July 16, 2013.

CLINICAL DATA: Right upper quadrant abdominal pain.

EXAM:
ULTRASOUND ABDOMEN LIMITED RIGHT UPPER QUADRANT

[Series 1: us abdomen limited mc & wl · 15 of 58 slices shown]
[im 1/58]
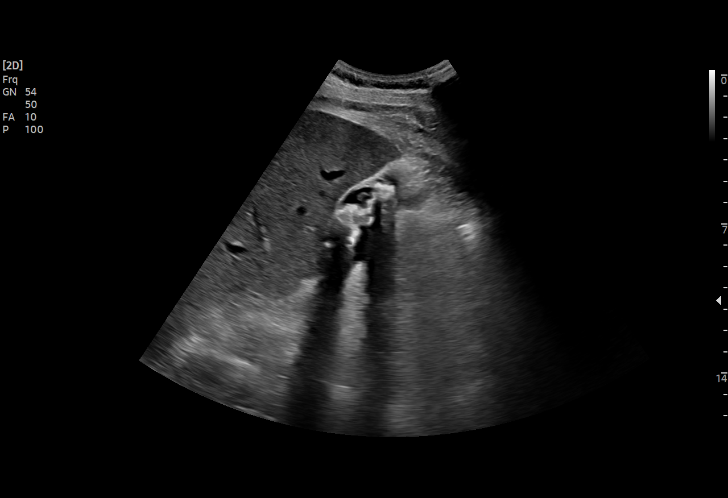
[im 5/58]
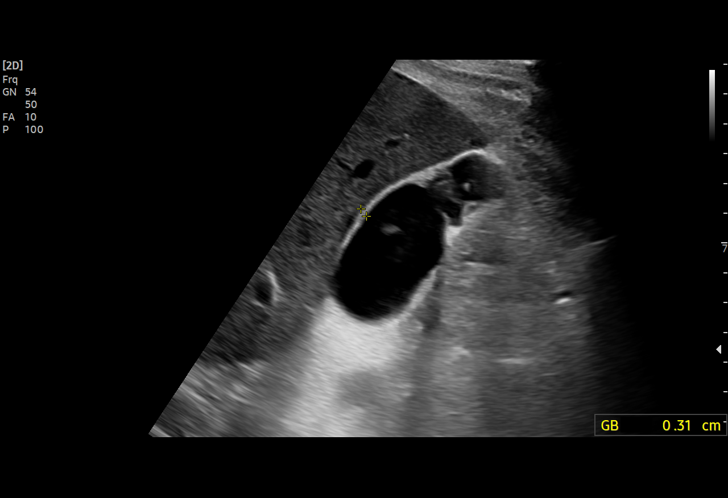
[im 10/58]
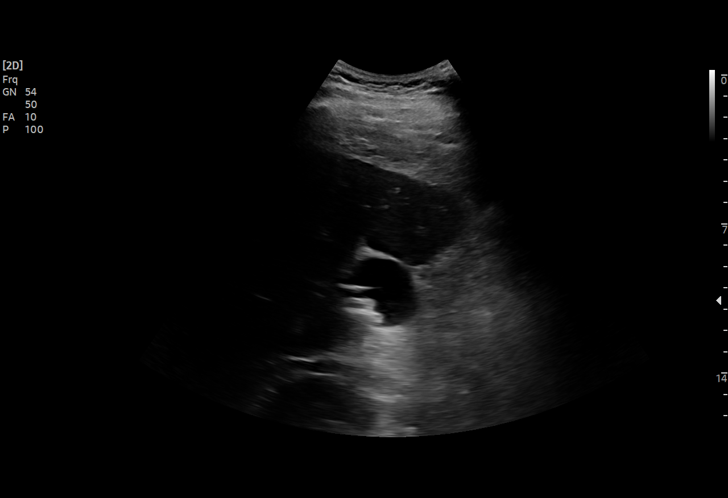
[im 12/58]
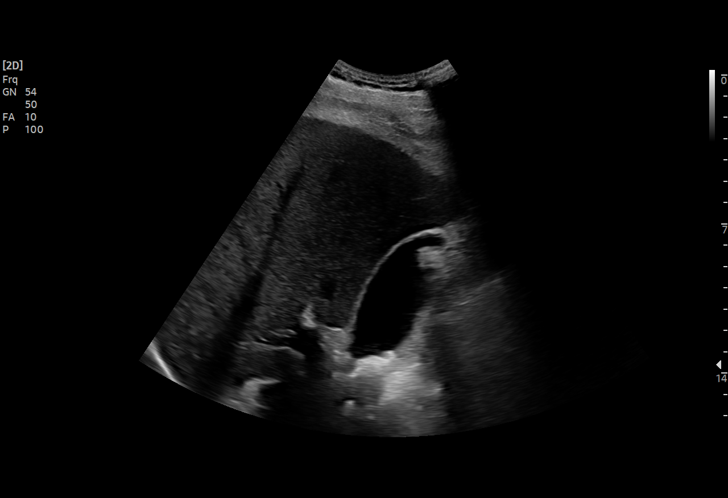
[im 17/58]
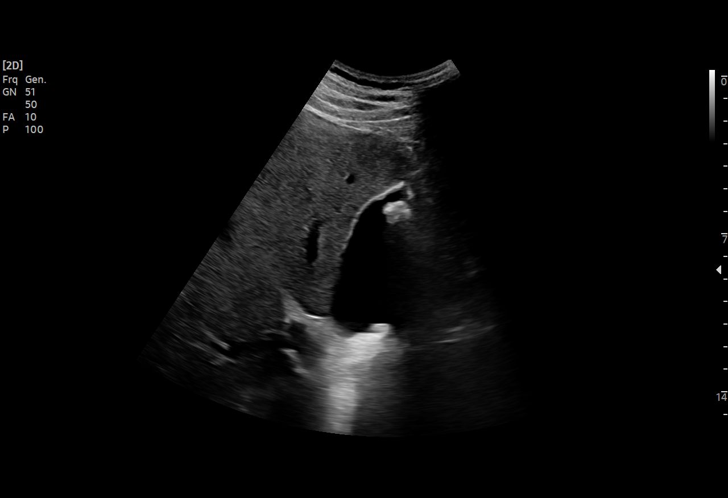
[im 22/58]
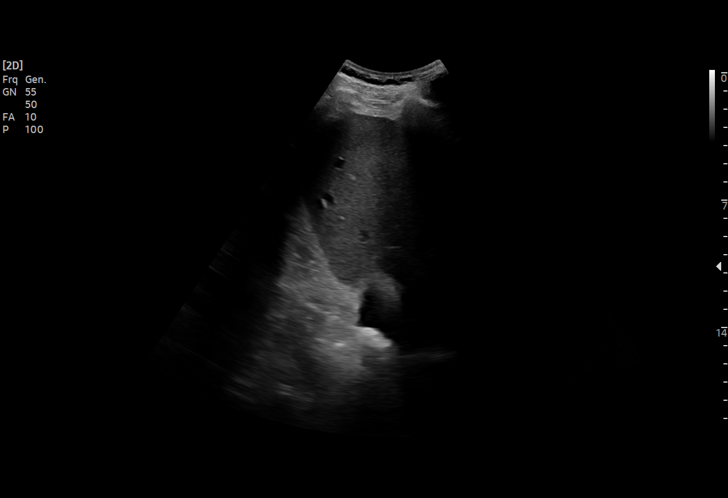
[im 24/58]
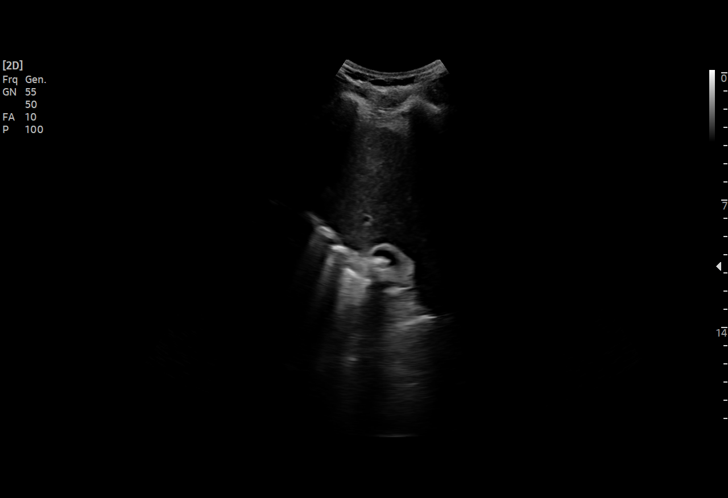
[im 29/58]
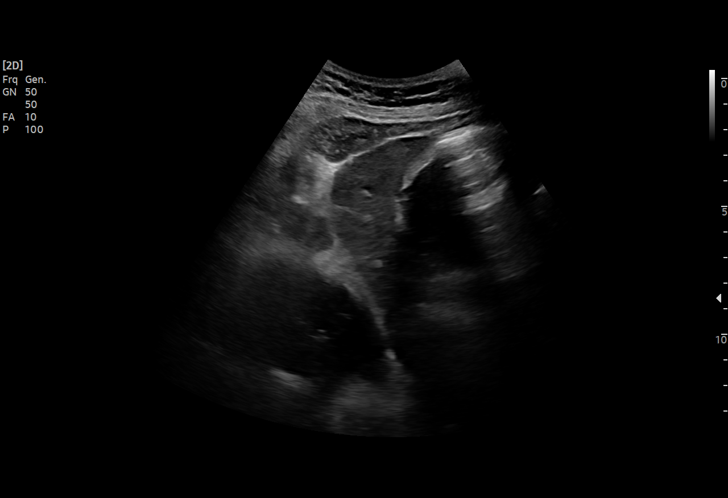
[im 34/58]
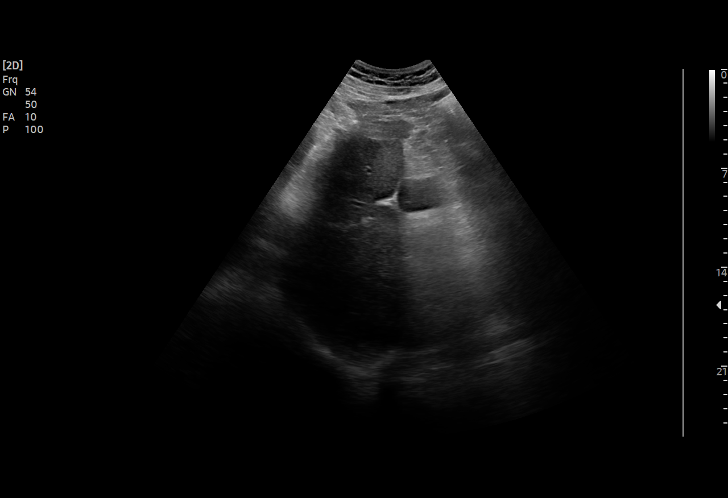
[im 36/58]
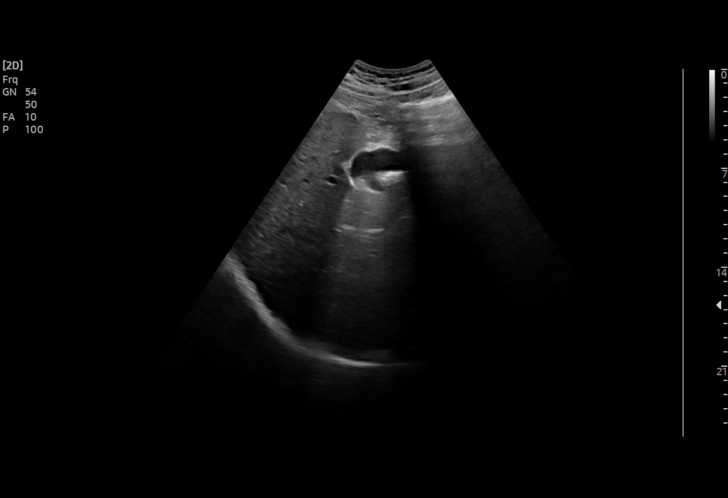
[im 41/58]
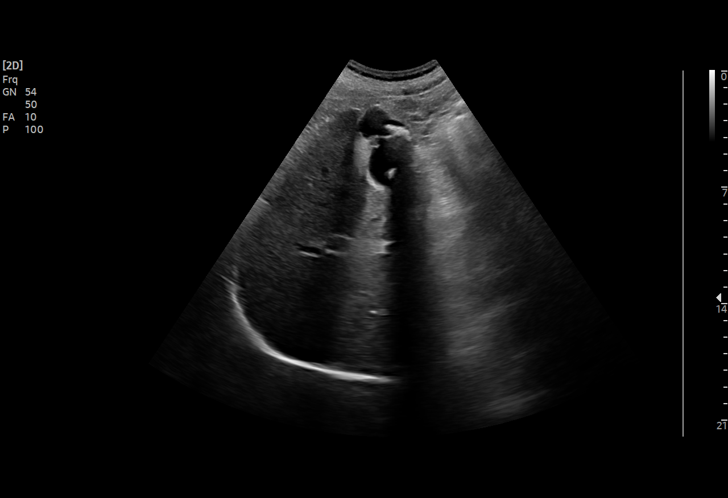
[im 46/58]
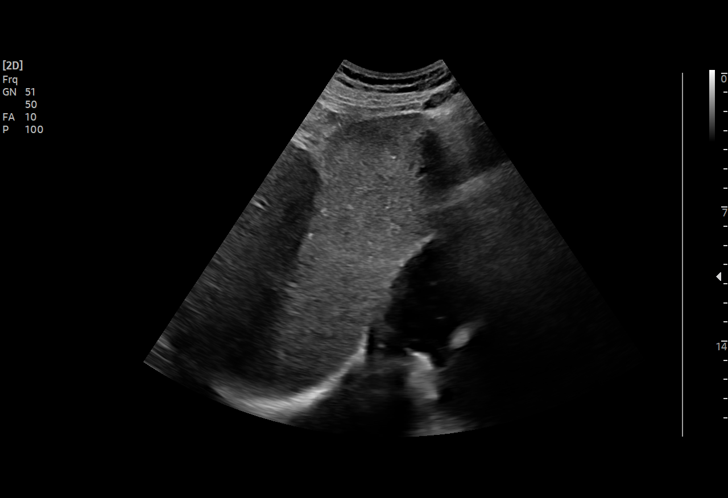
[im 48/58]
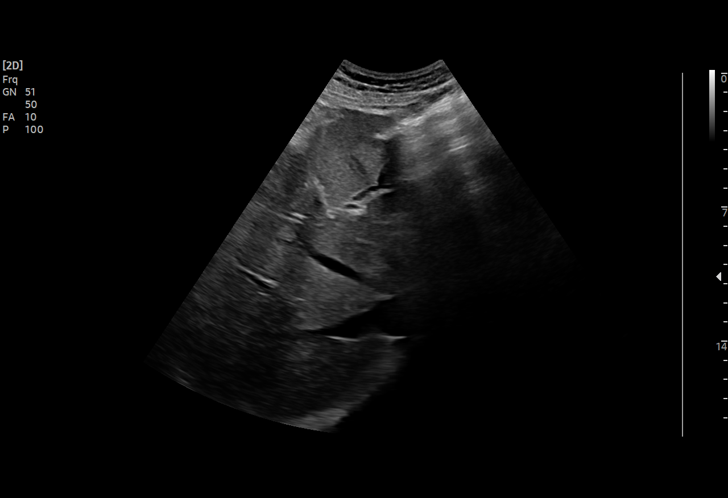
[im 53/58]
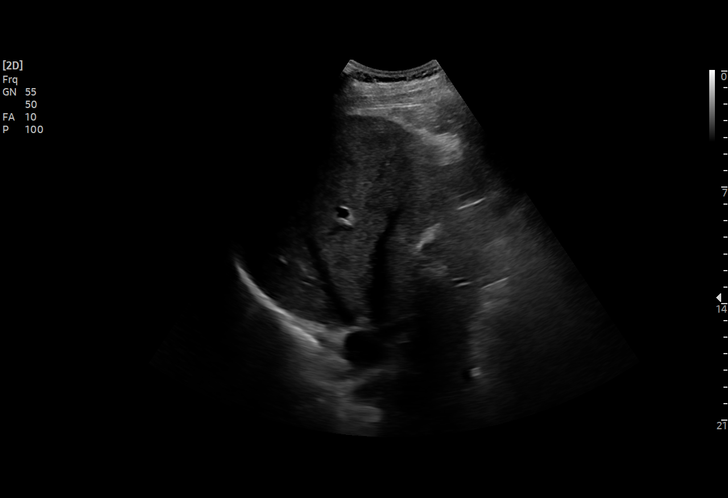
[im 58/58]
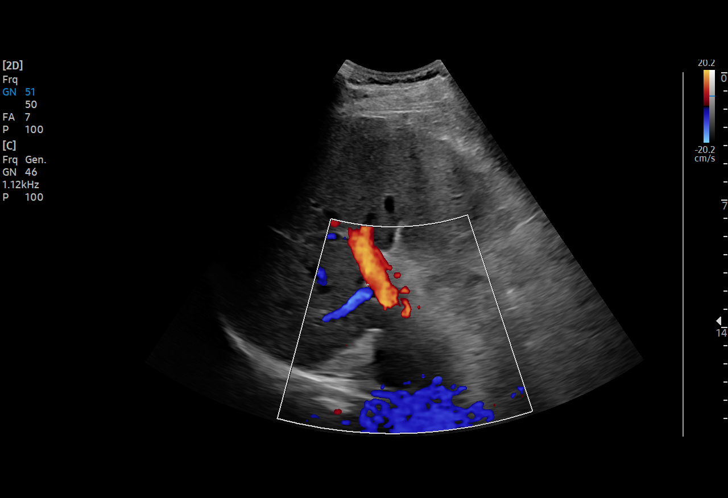

[15 of 25 positions shown; findings below may reference images not displayed]

FINDINGS: Gallbladder:

Cholelithiasis is noted without gallbladder wall thickening or
pericholecystic fluid. No sonographic Murphy's sign is noted.

Common bile duct:

Diameter: 4 mm which is within normal limits.

Liver:

No focal lesion identified. Within normal limits in parenchymal
echogenicity. Portal vein is patent on color Doppler imaging with
normal direction of blood flow towards the liver.

Other: None.
IMPRESSION: Cholelithiasis without evidence of cholecystitis. No other
abnormality seen in the right upper quadrant of the abdomen.

## 2023-06-14 ENCOUNTER — Encounter: Payer: Self-pay | Admitting: Gastroenterology

## 2023-06-20 ENCOUNTER — Encounter: Payer: Self-pay | Admitting: Gastroenterology

## 2023-07-21 ENCOUNTER — Ambulatory Visit (AMBULATORY_SURGERY_CENTER): Payer: BC Managed Care – PPO

## 2023-07-21 ENCOUNTER — Encounter: Payer: Self-pay | Admitting: Gastroenterology

## 2023-07-21 VITALS — Ht 74.0 in | Wt 240.0 lb

## 2023-07-21 DIAGNOSIS — Z860100 Personal history of colon polyps, unspecified: Secondary | ICD-10-CM

## 2023-07-21 MED ORDER — NA SULFATE-K SULFATE-MG SULF 17.5-3.13-1.6 GM/177ML PO SOLN: 1.0000 | Freq: Once | ORAL | 0 refills | Status: AC

## 2023-07-21 NOTE — Progress Notes (Signed)

## 2023-08-09 ENCOUNTER — Encounter: Payer: Self-pay | Admitting: Gastroenterology

## 2023-08-09 ENCOUNTER — Ambulatory Visit: Payer: BC Managed Care – PPO | Admitting: Gastroenterology

## 2023-08-09 VITALS — BP 133/74 | HR 69 | Temp 98.2°F | Resp 13 | Ht 74.0 in | Wt 240.0 lb

## 2023-08-09 DIAGNOSIS — K640 First degree hemorrhoids: Secondary | ICD-10-CM

## 2023-08-09 DIAGNOSIS — D123 Benign neoplasm of transverse colon: Secondary | ICD-10-CM

## 2023-08-09 DIAGNOSIS — Z860100 Personal history of colon polyps, unspecified: Secondary | ICD-10-CM

## 2023-08-09 DIAGNOSIS — K635 Polyp of colon: Secondary | ICD-10-CM | POA: Diagnosis not present

## 2023-08-09 DIAGNOSIS — Z1211 Encounter for screening for malignant neoplasm of colon: Secondary | ICD-10-CM

## 2023-08-09 DIAGNOSIS — Z860101 Personal history of adenomatous and serrated colon polyps: Secondary | ICD-10-CM

## 2023-08-09 MED ORDER — SODIUM CHLORIDE 0.9 % IV SOLN: 500.0000 mL | INTRAVENOUS | Status: AC

## 2023-08-09 NOTE — Progress Notes (Signed)
Called to room to assist during endoscopic procedure.  Patient ID and intended procedure confirmed with present staff. Received instructions for my participation in the procedure from the performing physician.  

## 2023-08-09 NOTE — Op Note (Signed)
[facility] Patient Name: Anthony Moody Procedure Date: 08/09/2023 9:05 AM MRN: [id number] Endoscopist: Meryl Dare , MD, [phone] Age: 67 Referring MD:  Date of Birth: 1956/08/06 Gender: Male Account #: [id number] Procedure:                Colonoscopy Indications:              Surveillance: Personal history of adenomatous and                            sessile serrated polyps on last colonoscopy 3 years                            ago Medicines:                Monitored Anesthesia Care Procedure:                Pre-Anesthesia Assessment:                           - Prior to the procedure, a History and Physical                            was performed, and patient medications and                            allergies were reviewed. The patient's tolerance of                            previous anesthesia was also reviewed. The risks                            and benefits of the procedure and the sedation                            options and risks were discussed with the patient.                            All questions were answered, and informed consent                            was obtained. Prior Anticoagulants: The patient has                            taken no anticoagulant or antiplatelet agents. ASA                            Grade Assessment: II - A patient with mild systemic                            disease. After reviewing the risks and benefits,                            the patient was deemed in satisfactory condition to  undergo the procedure.                           After obtaining informed consent, the colonoscope                            was passed under direct vision. Throughout the                            procedure, the patient's blood pressure, pulse, and                            oxygen saturations were monitored continuously. The                            CF [id number] #[id number] was introduced through the anus                             and advanced to the the cecum, identified by                            appendiceal orifice and ileocecal valve. The                            ileocecal valve, appendiceal orifice, and rectum                            were photographed. The quality of the bowel                            preparation was good. The colonoscopy was somewhat                            difficult due to a redundant colon, significant                            looping, a tortuous colon and spasm. 0.5 mg IV                            glucagon given. The patient tolerated the procedure                            well. Scope In: 9:09:24 AM Scope Out: 9:32:47 AM Scope Withdrawal Time: 0 hours 19 minutes 1 second  Total Procedure Duration: 0 hours 23 minutes 23 seconds  Findings:                 The perianal and digital rectal examinations were                            normal.                           A 7 mm polyp was found in the transverse colon. The  polyp was sessile. The polyp was removed with a                            cold snare. Resection and retrieval were complete.                           External and internal hemorrhoids were found during                            retroflexion. The hemorrhoids were small and Grade                            I (internal hemorrhoids that do not prolapse).                           The exam was otherwise without abnormality on                            direct and retroflexion views. Complications:            No immediate complications. Estimated blood loss:                            None. Estimated Blood Loss:     Estimated blood loss: none. Impression:               - One 7 mm polyp in the transverse colon, removed                            with a cold snare. Resected and retrieved.                           - External and internal hemorrhoids.                           - The examination was otherwise  normal on direct                            and retroflexion views. Recommendation:           - Repeat colonoscopy after studies are complete for                            surveillance based on pathology results.                           - Patient has a contact number available for                            emergencies. The signs and symptoms of potential                            delayed complications were discussed with the                            patient.  Return to normal activities tomorrow.                            Written discharge instructions were provided to the                            patient.                           - Resume previous diet.                           - Continue present medications.                           - Await pathology results. Meryl Dare, MD 08/09/2023 9:37:46 AM This report has been signed electronically.

## 2023-08-09 NOTE — Patient Instructions (Signed)
Please read handouts provided. Continue present medications. Await pathology results. Resume previous diet.   YOU HAD AN ENDOSCOPIC PROCEDURE TODAY AT THE [facility]:   Refer to the procedure report that was given to you for any specific questions about what was found during the examination.  If the procedure report does not answer your questions, please call your gastroenterologist to clarify.  If you requested that your care partner not be given the details of your procedure findings, then the procedure report has been included in a sealed envelope for you to review at your convenience later.  YOU SHOULD EXPECT: Some feelings of bloating in the abdomen. Passage of more gas than usual.  Walking can help get rid of the air that was put into your GI tract during the procedure and reduce the bloating. If you had a lower endoscopy (such as a colonoscopy or flexible sigmoidoscopy) you may notice spotting of blood in your stool or on the toilet paper. If you underwent a bowel prep for your procedure, you may not have a normal bowel movement for a few days.  Please Note:  You might notice some irritation and congestion in your nose or some drainage.  This is from the oxygen used during your procedure.  There is no need for concern and it should clear up in a day or so.  SYMPTOMS TO REPORT IMMEDIATELY:  Following lower endoscopy (colonoscopy or flexible sigmoidoscopy):  Excessive amounts of blood in the stool  Significant tenderness or worsening of abdominal pains  Swelling of the abdomen that is new, acute  Fever of 100F or higher.  For urgent or emergent issues, a gastroenterologist can be reached at any hour by calling [phone]. Do not use MyChart messaging for urgent concerns.    DIET:  We do recommend a small meal at first, but then you may proceed to your regular diet.  Drink plenty of fluids but you should avoid alcoholic beverages for 24 hours.  ACTIVITY:  You should  plan to take it easy for the rest of today and you should NOT DRIVE or use heavy machinery until tomorrow (because of the sedation medicines used during the test).    FOLLOW UP: Our staff will call the number listed on your records the next business day following your procedure.  We will call around 7:15- 8:00 am to check on you and address any questions or concerns that you may have regarding the information given to you following your procedure. If we do not reach you, we will leave a message.     If any biopsies were taken you will be contacted by phone or by letter within the next 1-3 weeks.  Please call us at [phone] if you have not heard about the biopsies in 3 weeks.    SIGNATURES/CONFIDENTIALITY: You and/or your care partner have signed paperwork which will be entered into your electronic medical record.  These signatures attest to the fact that that the information above on your After Visit Summary has been reviewed and is understood.  Full responsibility of the confidentiality of this discharge information lies with you and/or your care-partner.  

## 2023-08-09 NOTE — Progress Notes (Signed)
History & Physical  Primary Care Physician:  Alysia Penna, MD Primary Gastroenterologist: Claudette Head, MD  Impression / Plan:  Personal history of adenomatous and sessile serrated colon polyps for surveillance colonoscopy.  CHIEF COMPLAINT:  Personal history of colon polyps   HPI: Anthony Moody is a 67 y.o. male with a personal history of adenomatous and sessile serrated colon polyps for surveillance colonoscopy.  Colonoscopy in 2021 was somewhat difficult with redundant colon, significant looping and focal areas of spasm treated with glucagon.   Past Medical History:  Diagnosis Date   Hernia    High cholesterol    Hypertension    stomach ulcer 07/16/2013    Past Surgical History:  Procedure Laterality Date   COLONOSCOPY  2015   POLYPECTOMY  2015   TONSILLECTOMY AND ADENOIDECTOMY  09/05/1966   WISDOM TOOTH EXTRACTION  1978    Prior to Admission medications   Medication Sig Start Date End Date Taking? Authorizing Provider  amLODipine (NORVASC) 5 MG tablet SMARTSIG:1 Tablet(s) By Mouth Every Evening   Yes [provider]  atorvastatin (LIPITOR) 40 MG tablet Take 40 mg by mouth at bedtime. 05/12/20  Yes [provider]  telmisartan (MICARDIS) 80 MG tablet Take 80 mg by mouth daily. 04/28/20  Yes [provider]  ondansetron (ZOFRAN) 4 MG tablet Take 1 tablet (4 mg total) by mouth every 8 (eight) hours as needed for nausea or vomiting. Patient not taking: Reported on 07/21/2023 10/22/21   Tegeler, Canary Brim, MD  oxyCODONE-acetaminophen (PERCOCET/ROXICET) 5-325 MG tablet Take 1 tablet by mouth every 4 (four) hours as needed for severe pain. 10/22/21   Tegeler, Canary Brim, MD    Current Outpatient Medications  Medication Sig Dispense Refill   amLODipine (NORVASC) 5 MG tablet SMARTSIG:1 Tablet(s) By Mouth Every Evening     atorvastatin (LIPITOR) 40 MG tablet Take 40 mg by mouth at bedtime.     telmisartan (MICARDIS) 80 MG tablet Take 80 mg by  mouth daily.     ondansetron (ZOFRAN) 4 MG tablet Take 1 tablet (4 mg total) by mouth every 8 (eight) hours as needed for nausea or vomiting. (Patient not taking: Reported on 07/21/2023) 12 tablet 0   oxyCODONE-acetaminophen (PERCOCET/ROXICET) 5-325 MG tablet Take 1 tablet by mouth every 4 (four) hours as needed for severe pain. 15 tablet 0   Current Facility-Administered Medications  Medication Dose Route Frequency Provider Last Rate Last Admin   0.9 %  sodium chloride infusion  500 mL Intravenous Continuous Meryl Dare, MD        Allergies as of 08/09/2023   (No Known Allergies)    Family History  Problem Relation Age of Onset   Heart disease Mother    Heart disease Father    Hypertension Father    Colon cancer Neg Hx    Colon polyps Neg Hx    Stomach cancer Neg Hx    Esophageal cancer Neg Hx    Rectal cancer Neg Hx     Social History   Socioeconomic History   Marital status: Married    Spouse name: Not on file   Number of children: 3   Years of education: Not on file   Highest education level: Not on file  Occupational History   Not on file  Tobacco Use   Smoking status: Never   Smokeless tobacco: Never  Vaping Use   Vaping status: Never Used  Substance and Sexual Activity   Alcohol use: Yes    Alcohol/week:  5.0 standard drinks of alcohol    Types: 5 Cans of beer per week    Comment: occ   Drug use: No   Sexual activity: Not on file  Other Topics Concern   Not on file  Social History Narrative   Not on file   Social Determinants of Health   Financial Resource Strain: Not on file  Food Insecurity: Not on file  Transportation Needs: Not on file  Physical Activity: Not on file  Stress: Not on file  Social Connections: Not on file  Intimate Partner Violence: Not on file    Review of Systems:  All systems reviewed were negative except where noted in HPI.   Physical Exam:  General:  Alert, well-developed, in NAD Head:  Normocephalic and  atraumatic. Eyes:  Sclera clear, no icterus.   Conjunctiva pink. Ears:  Normal auditory acuity. Mouth:  No deformity or lesions.  Neck:  Supple; no masses. Lungs:  Clear throughout to auscultation.   No wheezes, crackles, or rhonchi.  Heart:  Regular rate and rhythm; no murmurs. Abdomen:  Soft, nondistended, nontender. No masses, hepatomegaly. No palpable masses.  Normal bowel sounds.    Rectal:  Deferred   Msk:  Symmetrical without gross deformities. Extremities:  Without edema. Neurologic:  Alert and  oriented x 4; grossly normal neurologically. Skin:  Intact without significant lesions or rashes. Psych:  Alert and cooperative. Normal mood and affect.   Venita Lick. Russella Dar  08/09/2023, 9:00 AM See Loretha Stapler, [facility], to contact our on call provider

## 2023-08-09 NOTE — Progress Notes (Signed)
Placed a #9 oral airway.  Patient has severe sleep apnea.

## 2023-08-09 NOTE — Progress Notes (Signed)
Vss nad trans to pacu 

## 2023-08-10 ENCOUNTER — Telehealth: Payer: Self-pay | Admitting: *Deleted

## 2023-08-10 NOTE — Telephone Encounter (Signed)
  Follow up Call-     08/09/2023    8:43 AM  Call back number  Post procedure Call Back phone  # [phone]  Permission to leave phone message Yes     Patient questions:  Do you have a fever, pain , or abdominal swelling? No. Pain Score  0 *  Have you tolerated food without any problems? Yes.    Have you been able to return to your normal activities? Yes.    Do you have any questions about your discharge instructions: Diet   No. Medications  No. Follow up visit  No.  Do you have questions or concerns about your Care? No.  Actions: * If pain score is 4 or above: No action needed, pain <4.

## 2023-08-14 LAB — SURGICAL PATHOLOGY

## 2023-08-25 NOTE — Code Documentation (Signed)
[id number] 

## 2023-08-28 ENCOUNTER — Encounter: Payer: Self-pay | Admitting: Gastroenterology

## 2023-09-15 DIAGNOSIS — N400 Enlarged prostate without lower urinary tract symptoms: Secondary | ICD-10-CM | POA: Diagnosis not present

## 2023-09-15 DIAGNOSIS — E785 Hyperlipidemia, unspecified: Secondary | ICD-10-CM | POA: Diagnosis not present

## 2023-09-22 DIAGNOSIS — Z1339 Encounter for screening examination for other mental health and behavioral disorders: Secondary | ICD-10-CM | POA: Diagnosis not present

## 2023-09-22 DIAGNOSIS — Z0000 Encounter for general adult medical examination without abnormal findings: Secondary | ICD-10-CM | POA: Diagnosis not present

## 2023-09-22 DIAGNOSIS — I10 Essential (primary) hypertension: Secondary | ICD-10-CM | POA: Diagnosis not present

## 2023-09-22 DIAGNOSIS — Z1331 Encounter for screening for depression: Secondary | ICD-10-CM | POA: Diagnosis not present

## 2023-09-22 DIAGNOSIS — R82998 Other abnormal findings in urine: Secondary | ICD-10-CM | POA: Diagnosis not present

## 2023-11-07 DIAGNOSIS — R972 Elevated prostate specific antigen [PSA]: Secondary | ICD-10-CM | POA: Diagnosis not present

## 2023-12-19 DIAGNOSIS — R972 Elevated prostate specific antigen [PSA]: Secondary | ICD-10-CM | POA: Diagnosis not present

## 2023-12-28 DIAGNOSIS — N400 Enlarged prostate without lower urinary tract symptoms: Secondary | ICD-10-CM | POA: Diagnosis not present

## 2023-12-28 DIAGNOSIS — R972 Elevated prostate specific antigen [PSA]: Secondary | ICD-10-CM | POA: Diagnosis not present

## 2024-06-25 DIAGNOSIS — R972 Elevated prostate specific antigen [PSA]: Secondary | ICD-10-CM | POA: Diagnosis not present

## 2024-06-25 DIAGNOSIS — R3914 Feeling of incomplete bladder emptying: Secondary | ICD-10-CM | POA: Diagnosis not present
